# Patient Record
Sex: Female | Born: 1960 | Race: White | Hispanic: No | Marital: Married | Smoking: Never smoker
Health system: Southern US, Community
[De-identification: ages and names within clinical notes are randomized; demographics above are authoritative.]

## PROBLEM LIST (undated history)

## (undated) DIAGNOSIS — R87619 Unspecified abnormal cytological findings in specimens from cervix uteri: Secondary | ICD-10-CM

## (undated) DIAGNOSIS — B977 Papillomavirus as the cause of diseases classified elsewhere: Secondary | ICD-10-CM

## (undated) HISTORY — PX: LASER ABLATION CONDYLOMA CERVICAL / VULVAR: SUR819

## (undated) HISTORY — DX: Unspecified abnormal cytological findings in specimens from cervix uteri: R87.619

## (undated) HISTORY — DX: Papillomavirus as the cause of diseases classified elsewhere: B97.7

---
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
## 2015-04-28 DIAGNOSIS — M9906 Segmental and somatic dysfunction of lower extremity: Secondary | ICD-10-CM | POA: Diagnosis not present

## 2015-04-28 DIAGNOSIS — M25512 Pain in left shoulder: Secondary | ICD-10-CM | POA: Diagnosis not present

## 2015-04-28 DIAGNOSIS — M9903 Segmental and somatic dysfunction of lumbar region: Secondary | ICD-10-CM | POA: Diagnosis not present

## 2015-04-28 DIAGNOSIS — M9907 Segmental and somatic dysfunction of upper extremity: Secondary | ICD-10-CM | POA: Diagnosis not present

## 2016-04-01 DIAGNOSIS — J0140 Acute pansinusitis, unspecified: Secondary | ICD-10-CM | POA: Diagnosis not present

## 2016-04-13 ENCOUNTER — Encounter: Payer: Self-pay | Admitting: Obstetrics & Gynecology

## 2016-04-13 ENCOUNTER — Ambulatory Visit (INDEPENDENT_AMBULATORY_CARE_PROVIDER_SITE_OTHER): Payer: BLUE CROSS/BLUE SHIELD | Admitting: Obstetrics & Gynecology

## 2016-04-13 VITALS — BP 134/89 | HR 77 | Ht 67.0 in | Wt 161.0 lb

## 2016-04-13 DIAGNOSIS — Z01419 Encounter for gynecological examination (general) (routine) without abnormal findings: Secondary | ICD-10-CM | POA: Diagnosis not present

## 2016-04-13 NOTE — Progress Notes (Signed)
Subjective:    Connie Massey is a 56 y.o. MW P2 (61 and 20, 3 grands) female who presents for an annual exam. She moved her from [place]. She is not happy with increased hot flashes over the past several months. She has not tried OTC meds yet. The patient is sexually active. GYN screening history: last pap: was normal. The patient wears seatbelts: yes. The patient participates in regular exercise: yes. Has the patient ever been transfused or tattooed?: no. The patient reports that there is not domestic violence in her life.   Menstrual History: OB History    Gravida Para Term Preterm AB Living   2 2 2     2    SAB TAB Ectopic Multiple Live Births                  Menarche age: 24 No LMP recorded. Patient is postmenopausal.    The following portions of the patient's history were reviewed and updated as appropriate: allergies, current medications, past family history, past medical history, past social history, past surgical history and problem list.  Review of Systems Pertinent items are noted in HPI.   Married for 36 years Denies dyspareunia Menopause was at 56 yo Negligible stress incontinence FH- + breast cancer in mGM and Maunt in later years, +lymphoma in her mom, no gyn or colon cancer No colonscopy ever No FP mammo due Had laser surgery on her cervix in the 1980s for viral infection  Objective:    BP 134/89   Pulse 77   Ht 5\' 7"  (1.702 m)   Wt 161 lb (73 kg)   BMI 25.22 kg/m   General Appearance:    Alert, cooperative, no distress, appears stated age  Head:    Normocephalic, without obvious abnormality, atraumatic  Eyes:    PERRL, conjunctiva/corneas clear, EOM's intact, fundi    benign, both eyes  Ears:    Normal TM's and external ear canals, both ears  Nose:   Nares normal, septum midline, mucosa normal, no drainage    or sinus tenderness  Throat:   Lips, mucosa, and tongue normal; teeth and gums normal  Neck:   Supple, symmetrical, trachea midline, no adenopathy;   thyroid:  no enlargement/tenderness/nodules; no carotid   bruit or JVD  Back:     Symmetric, no curvature, ROM normal, no CVA tenderness  Lungs:     Clear to auscultation bilaterally, respirations unlabored  Chest Wall:    No tenderness or deformity   Heart:    Regular rate and rhythm, S1 and S2 normal, no murmur, rub   or gallop  Breast Exam:    No tenderness, masses, or nipple abnormality  Abdomen:     Soft, non-tender, bowel sounds active all four quadrants,    no masses, no organomegaly  Genitalia:    Normal female without lesion, discharge or tenderness, Vulvovaginal atrophy, NSSA, NT, mobile, NT, no adnexal masses     Extremities:   Extremities normal, atraumatic, no cyanosis or edema  Pulses:   2+ and symmetric all extremities  Skin:   Skin color, texture, turgor normal, no rashes or lesions  Lymph nodes:   Cervical, supraclavicular, and axillary nodes normal  Neurologic:   CNII-XII intact, normal strength, sensation and reflexes    throughout  .    Assessment:    Healthy female exam.    Plan:     Thin prep Pap smear. with cotesting Mammogram ordered Discussed treatments for hot flashes/v v a.

## 2016-04-17 LAB — CYTOLOGY - PAP
DIAGNOSIS: NEGATIVE
HPV: NOT DETECTED

## 2016-04-19 ENCOUNTER — Ambulatory Visit (INDEPENDENT_AMBULATORY_CARE_PROVIDER_SITE_OTHER): Payer: BLUE CROSS/BLUE SHIELD

## 2016-04-19 DIAGNOSIS — Z1231 Encounter for screening mammogram for malignant neoplasm of breast: Secondary | ICD-10-CM

## 2016-04-19 DIAGNOSIS — Z01419 Encounter for gynecological examination (general) (routine) without abnormal findings: Secondary | ICD-10-CM

## 2017-04-24 ENCOUNTER — Encounter: Payer: Self-pay | Admitting: Obstetrics & Gynecology

## 2017-04-24 ENCOUNTER — Ambulatory Visit (INDEPENDENT_AMBULATORY_CARE_PROVIDER_SITE_OTHER): Payer: BLUE CROSS/BLUE SHIELD | Admitting: Obstetrics & Gynecology

## 2017-04-24 VITALS — BP 141/80 | HR 76 | Ht 67.0 in | Wt 162.0 lb

## 2017-04-24 DIAGNOSIS — Z1151 Encounter for screening for human papillomavirus (HPV): Secondary | ICD-10-CM

## 2017-04-24 DIAGNOSIS — Z01419 Encounter for gynecological examination (general) (routine) without abnormal findings: Secondary | ICD-10-CM | POA: Diagnosis not present

## 2017-04-24 DIAGNOSIS — N852 Hypertrophy of uterus: Secondary | ICD-10-CM | POA: Diagnosis not present

## 2017-04-24 DIAGNOSIS — Z124 Encounter for screening for malignant neoplasm of cervix: Secondary | ICD-10-CM | POA: Diagnosis not present

## 2017-04-24 DIAGNOSIS — N898 Other specified noninflammatory disorders of vagina: Secondary | ICD-10-CM

## 2017-04-24 DIAGNOSIS — Z23 Encounter for immunization: Secondary | ICD-10-CM

## 2017-04-24 DIAGNOSIS — Z113 Encounter for screening for infections with a predominantly sexual mode of transmission: Secondary | ICD-10-CM

## 2017-04-24 NOTE — Progress Notes (Signed)
Pt c/o hot flashes.

## 2017-04-24 NOTE — Progress Notes (Signed)
Subjective:    Connie Massey is a 57 y.o. married P58female who presents for an annual exam. The patient has no complaints today. Hot flashes have returned but she wants to "live with them".  The patient is sexually active. GYN screening history: last pap: was normal. The patient wears seatbelts: yes. The patient participates in regular exercise: yes. Has the patient ever been transfused or tattooed?: no. The patient reports that there is not domestic violence in her life.   Menstrual History: OB History    Gravida  2   Para  2   Term  2   Preterm      AB      Living  2     SAB      TAB      Ectopic      Multiple      Live Births              Menarche age: 24 No LMP recorded. Patient is postmenopausal.    The following portions of the patient's history were reviewed and updated as appropriate: allergies, current medications, past family history, past medical history, past social history, past surgical history and problem list.  Review of Systems Pertinent items are noted in HPI.   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)   Objective:    BP (!) 141/80   Pulse 76   Ht 5\' 7"  (1.702 m)   Wt 162 lb (73.5 kg)   BMI 25.37 kg/m   General Appearance:    Alert, cooperative, no distress, appears stated age  Head:    Normocephalic, without obvious abnormality, atraumatic  Eyes:    PERRL, conjunctiva/corneas clear, EOM's intact, fundi    benign, both eyes  Ears:    Normal TM's and external ear canals, both ears  Nose:   Nares normal, septum midline, mucosa normal, no drainage    or sinus tenderness  Throat:   Lips, mucosa, and tongue normal; teeth and gums normal  Neck:   Supple, symmetrical, trachea midline, no adenopathy;    thyroid:  no enlargement/tenderness/nodules; no carotid   bruit or JVD  Back:     Symmetric, no curvature, ROM normal, no CVA tenderness  Lungs:     Clear to auscultation bilaterally, respirations unlabored  Chest Wall:     No tenderness or deformity   Heart:    Regular rate and rhythm, S1 and S2 normal, no murmur, rub   or gallop  Breast Exam:    No tenderness, masses, or nipple abnormality  Abdomen:     Soft, non-tender, bowel sounds active all four quadrants,    no masses, no organomegaly  Genitalia:    Normal female without lesion, discharge or tenderness, 6 week size, mobile, non tender, non palpable adnexa     Extremities:   Extremities normal, atraumatic, no cyanosis or edema  Pulses:   2+ and symmetric all extremities  Skin:   Skin color, texture, turgor normal, no rashes or lesions  Lymph nodes:   Cervical, supraclavicular, and axillary nodes normal  Neurologic:   CNII-XII intact, normal strength, sensation and reflexes    throughout  .    Assessment:    Healthy female exam.   change in uterine size Plan:     Thin prep Pap smear. with cotesting Fasting labs today (she did have a little yogourt about 4 hour ago) HIV, Hep C Discussed Colon screening TDAP Mammogram Gyn u/s

## 2017-04-25 LAB — LIPID PANEL
CHOL/HDL RATIO: 2.3 (calc) (ref <5.0)
CHOLESTEROL: 172 mg/dL (ref <200)
HDL: 74 mg/dL (ref >50)
LDL CHOLESTEROL (CALC): 84 mg/dL
Non-HDL Cholesterol (Calc): 98 mg/dL (calc) (ref <130)
TRIGLYCERIDES: 65 mg/dL (ref <150)

## 2017-04-25 LAB — HIV ANTIBODY (ROUTINE TESTING W REFLEX): HIV 1&2 Ab, 4th Generation: NONREACTIVE

## 2017-04-25 LAB — COMPREHENSIVE METABOLIC PANEL
AG Ratio: 1.3 (calc) (ref 1.0–2.5)
ALKALINE PHOSPHATASE (APISO): 62 U/L (ref 33–130)
ALT: 17 U/L (ref 6–29)
AST: 24 U/L (ref 10–35)
Albumin: 4.1 g/dL (ref 3.6–5.1)
BILIRUBIN TOTAL: 0.5 mg/dL (ref 0.2–1.2)
BUN: 14 mg/dL (ref 7–25)
CO2: 26 mmol/L (ref 20–32)
Calcium: 9.3 mg/dL (ref 8.6–10.4)
Chloride: 103 mmol/L (ref 98–110)
Creat: 0.73 mg/dL (ref 0.50–1.05)
Globulin: 3.1 g/dL (calc) (ref 1.9–3.7)
Glucose, Bld: 90 mg/dL (ref 65–99)
Potassium: 4.4 mmol/L (ref 3.5–5.3)
Sodium: 141 mmol/L (ref 135–146)
Total Protein: 7.2 g/dL (ref 6.1–8.1)

## 2017-04-25 LAB — CBC
HEMATOCRIT: 42.2 % (ref 35.0–45.0)
Hemoglobin: 15 g/dL (ref 11.7–15.5)
MCH: 31.6 pg (ref 27.0–33.0)
MCHC: 35.5 g/dL (ref 32.0–36.0)
MCV: 89 fL (ref 80.0–100.0)
MPV: 10.1 fL (ref 7.5–12.5)
Platelets: 208 10*3/uL (ref 140–400)
RBC: 4.74 10*6/uL (ref 3.80–5.10)
RDW: 11.6 % (ref 11.0–15.0)
WBC: 7.6 10*3/uL (ref 3.8–10.8)

## 2017-04-25 LAB — HEPATITIS C ANTIBODY
Hepatitis C Ab: NONREACTIVE
SIGNAL TO CUT-OFF: 0.03 (ref <1.00)

## 2017-04-25 LAB — VITAMIN D 25 HYDROXY (VIT D DEFICIENCY, FRACTURES): Vit D, 25-Hydroxy: 38 ng/mL (ref 30–100)

## 2017-04-25 LAB — TSH: TSH: 0.44 m[IU]/L (ref 0.40–4.50)

## 2017-04-26 LAB — CERVICOVAGINAL ANCILLARY ONLY
Bacterial vaginitis: POSITIVE — AB
Candida vaginitis: NEGATIVE

## 2017-04-27 LAB — CYTOLOGY - PAP
DIAGNOSIS: NEGATIVE
HPV: NOT DETECTED

## 2017-04-30 ENCOUNTER — Telehealth: Payer: Self-pay

## 2017-04-30 DIAGNOSIS — B9689 Other specified bacterial agents as the cause of diseases classified elsewhere: Secondary | ICD-10-CM

## 2017-04-30 DIAGNOSIS — N760 Acute vaginitis: Principal | ICD-10-CM

## 2017-04-30 MED ORDER — METRONIDAZOLE 500 MG PO TABS: 500.0000 mg | ORAL_TABLET | Freq: Two times a day (BID) | ORAL | 0 refills | Status: DC

## 2017-04-30 NOTE — Telephone Encounter (Signed)
PT returning my call. PT is aware of positive BV and Flagyl being sent to her pharmacy per Dr. Hulan Fray. Pt expressed understanding.

## 2017-04-30 NOTE — Telephone Encounter (Signed)
Attempted to call pt with positive BV results and to verify pharmacy for Flagyl. Pt did not answer. Left message on voicemail to call office for results and provided phone number.

## 2017-05-04 ENCOUNTER — Ambulatory Visit (INDEPENDENT_AMBULATORY_CARE_PROVIDER_SITE_OTHER): Payer: BLUE CROSS/BLUE SHIELD

## 2017-05-04 ENCOUNTER — Other Ambulatory Visit: Payer: Self-pay | Admitting: Obstetrics & Gynecology

## 2017-05-04 DIAGNOSIS — D251 Intramural leiomyoma of uterus: Secondary | ICD-10-CM

## 2017-05-04 DIAGNOSIS — Z1231 Encounter for screening mammogram for malignant neoplasm of breast: Secondary | ICD-10-CM

## 2017-05-04 DIAGNOSIS — Z01419 Encounter for gynecological examination (general) (routine) without abnormal findings: Secondary | ICD-10-CM

## 2017-05-04 DIAGNOSIS — N852 Hypertrophy of uterus: Secondary | ICD-10-CM

## 2017-11-08 ENCOUNTER — Ambulatory Visit (INDEPENDENT_AMBULATORY_CARE_PROVIDER_SITE_OTHER): Payer: 59 | Admitting: Family Medicine

## 2017-11-08 ENCOUNTER — Encounter: Payer: Self-pay | Admitting: Family Medicine

## 2017-11-08 VITALS — BP 108/67 | HR 79 | Resp 16 | Ht 67.0 in | Wt 166.0 lb

## 2017-11-08 DIAGNOSIS — R232 Flushing: Secondary | ICD-10-CM

## 2017-11-08 DIAGNOSIS — Z780 Asymptomatic menopausal state: Secondary | ICD-10-CM

## 2017-11-08 HISTORY — DX: Asymptomatic menopausal state: Z78.0

## 2017-11-08 HISTORY — DX: Flushing: R23.2

## 2017-11-08 MED ORDER — PROGESTERONE MICRONIZED 100 MG PO CAPS
100.0000 mg | ORAL_CAPSULE | Freq: Every day | ORAL | 3 refills | Status: DC
Start: 2017-11-08 — End: 2018-11-07

## 2017-11-08 MED ORDER — ESTRADIOL 0.05 MG/24HR TD PTTW: 1.0000 | MEDICATED_PATCH | TRANSDERMAL | 3 refills | Status: DC

## 2017-11-08 NOTE — Progress Notes (Signed)
   Subjective:    Patient ID: Connie Massey is a 57 y.o. female presenting with menopausal issues  on 11/08/2017  HPI: For past 2 years has c/o hot flashes with her annual. Was trying to just deal with them, as she does not like meds, but has noticed worsening. Having hot flashes and getting dizzy while having them. Some days there are 10 and sometimes only 1. Having night sweats as well and soaks the bed. Unsure what is triggering. Has tried [place] and diet. Nothing seems to be working. Notes increasing vaginal dryness. No cycle x 2 years. Reports problems with intimacy due to painful intercourse and then she is avoiding this, despite good water based lubricants.  Review of Systems  Constitutional: Negative for chills and fever.  Respiratory: Negative for shortness of breath.   Cardiovascular: Negative for chest pain.  Gastrointestinal: Negative for abdominal pain, nausea and vomiting.  Genitourinary: Negative for dysuria.  Skin: Negative for rash.      Objective:    BP 108/67   Pulse 79   Resp 16   Ht 5\' 7"  (1.702 m)   Wt 166 lb (75.3 kg)   BMI 26.00 kg/m  Physical Exam  Constitutional: She is oriented to person, place, and time. She appears well-developed and well-nourished. No distress.  HENT:  Head: Normocephalic and atraumatic.  Eyes: No scleral icterus.  Neck: Neck supple.  Cardiovascular: Normal rate.  Pulmonary/Chest: Effort normal.  Abdominal: Soft.  Neurological: She is alert and oriented to person, place, and time.  Skin: Skin is warm and dry.  Psychiatric: She has a normal mood and affect.      Assessment & Plan:   Problem List Items Addressed This Visit      Unprioritized   Hot flashes - Primary   Relevant Medications   estradiol (VIVELLE-DOT) 0.05 MG/24HR patch   progesterone (PROMETRIUM) 100 MG capsule   Menopause    Advised of HRT risks and benefits.  Discussed bone loss, supplemental Ca and Vitamin D, heart health, breast cancer.  Also discussed  changes related to menopause, normal progression and resolution of symptoms.  Advised to not stay on longer than 5 years and to be on lowest dose possible. Will proceed with transdermal estrogen and daily prometrium (consider levonorgestrel containing IUD for endometrial protection).           Total face-to-face time with patient: 25 minutes. Over 50% of encounter was spent on counseling and coordination of care. Return in about 4 weeks (around 12/06/2017) for a follow-up.  Donnamae Jude 11/08/2017 1:41 PM

## 2017-11-08 NOTE — Assessment & Plan Note (Signed)
Advised of HRT risks and benefits.  Discussed bone loss, supplemental Ca and Vitamin D, heart health, breast cancer.  Also discussed changes related to menopause, normal progression and resolution of symptoms.  Advised to not stay on longer than 5 years and to be on lowest dose possible. Will proceed with transdermal estrogen and daily prometrium (consider levonorgestrel containing IUD for endometrial protection).

## 2017-12-06 ENCOUNTER — Ambulatory Visit (INDEPENDENT_AMBULATORY_CARE_PROVIDER_SITE_OTHER): Payer: 59 | Admitting: Obstetrics & Gynecology

## 2017-12-06 ENCOUNTER — Encounter: Payer: Self-pay | Admitting: Obstetrics & Gynecology

## 2017-12-06 VITALS — BP 124/80 | HR 73 | Resp 16 | Ht 67.0 in | Wt 165.0 lb

## 2017-12-06 DIAGNOSIS — R232 Flushing: Secondary | ICD-10-CM | POA: Diagnosis not present

## 2017-12-06 DIAGNOSIS — Z780 Asymptomatic menopausal state: Secondary | ICD-10-CM

## 2017-12-06 NOTE — Progress Notes (Signed)
   Subjective:    Patient ID: Connie Massey, female    DOB: 03-18-1960, 57 y.o.   MRN: [id number]  HPI 57 yo married P2 here for follow up after starting vivielle dot (.05mg /24 hour) and prometrium 100mg  qhs. She is very happy that the hot flashes are markedly improved. 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 She reports less vaginal dryness. These things all started to improve within a few weeks of starting the meds. She has had some breast tenderness.   Review of Systems Pap normal 4/19 Mammogram 5/19    Objective:   Physical Exam Breathing, conversing, and ambulating normally Well nourished, well hydrated White female, no apparent distress Vulva with only mild atrophy Abd- benign     Assessment & Plan:  Menopausal symptoms much improved with meds With regard to the breast tenderness, I offered watchful waiting versus changing to an oral pill. At this time she would like to continue with the current plan Come back 1 year/prn sooner

## 2018-08-07 ENCOUNTER — Other Ambulatory Visit: Payer: Self-pay | Admitting: Obstetrics & Gynecology

## 2018-08-07 DIAGNOSIS — Z1239 Encounter for other screening for malignant neoplasm of breast: Secondary | ICD-10-CM

## 2018-09-12 ENCOUNTER — Other Ambulatory Visit: Payer: Self-pay

## 2018-09-12 ENCOUNTER — Ambulatory Visit (INDEPENDENT_AMBULATORY_CARE_PROVIDER_SITE_OTHER): Payer: 59

## 2018-09-12 DIAGNOSIS — Z1239 Encounter for other screening for malignant neoplasm of breast: Secondary | ICD-10-CM

## 2018-11-07 ENCOUNTER — Encounter: Payer: Self-pay | Admitting: Obstetrics & Gynecology

## 2018-11-07 ENCOUNTER — Ambulatory Visit (INDEPENDENT_AMBULATORY_CARE_PROVIDER_SITE_OTHER): Payer: 59 | Admitting: Obstetrics & Gynecology

## 2018-11-07 ENCOUNTER — Other Ambulatory Visit: Payer: Self-pay

## 2018-11-07 VITALS — BP 117/75 | HR 72 | Resp 16 | Ht 67.0 in | Wt 169.0 lb

## 2018-11-07 DIAGNOSIS — Z1151 Encounter for screening for human papillomavirus (HPV): Secondary | ICD-10-CM

## 2018-11-07 DIAGNOSIS — Z124 Encounter for screening for malignant neoplasm of cervix: Secondary | ICD-10-CM

## 2018-11-07 DIAGNOSIS — R232 Flushing: Secondary | ICD-10-CM

## 2018-11-07 DIAGNOSIS — Z01419 Encounter for gynecological examination (general) (routine) without abnormal findings: Secondary | ICD-10-CM | POA: Diagnosis not present

## 2018-11-07 MED ORDER — PROGESTERONE MICRONIZED 100 MG PO CAPS: 100.0000 mg | ORAL_CAPSULE | Freq: Every day | ORAL | 5 refills | Status: DC

## 2018-11-07 MED ORDER — ESTRADIOL 0.05 MG/24HR TD PTTW: 1.0000 | MEDICATED_PATCH | TRANSDERMAL | 6 refills | Status: DC

## 2018-11-07 NOTE — Progress Notes (Signed)
Subjective:    Connie Massey is a 58 y.o. married P2 (38 and 40 yo kids, 3 1/2 grands) who presents for an annual exam. The patient has no complaints today. The patient is sexually active. GYN screening history: last pap: was normal. The patient wears seatbelts: yes. The patient participates in regular exercise: yes. Has the patient ever been transfused or tattooed?: no. The patient reports that there is not domestic violence in her life.   Menstrual History: OB History    Gravida  2   Para  2   Term  2   Preterm      AB      Living  2     SAB      TAB      Ectopic      Multiple      Live Births              Menarche age: 76 No LMP recorded. Patient is postmenopausal.    The following portions of the patient's history were reviewed and updated as appropriate: allergies, current medications, past family history, past medical history, past social history, past surgical history and problem list.  Review of Systems Pertinent items are noted in HPI.   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)   Objective:    BP 117/75   Pulse 72   Resp 16   Ht 5\' 7"  (1.702 m)   Wt 169 lb (76.7 kg)   BMI 26.47 kg/m   General Appearance:    Alert, cooperative, no distress, appears stated age  Head:    Normocephalic, without obvious abnormality, atraumatic  Eyes:    PERRL, conjunctiva/corneas clear, EOM's intact, fundi    benign, both eyes  Ears:    Normal TM's and external ear canals, both ears  Nose:   Nares normal, septum midline, mucosa normal, no drainage    or sinus tenderness  Throat:   Lips, mucosa, and tongue normal; teeth and gums normal  Neck:   Supple, symmetrical, trachea midline, no adenopathy;    thyroid:  no enlargement/tenderness/nodules; no carotid   bruit or JVD  Back:     Symmetric, no curvature, ROM normal, no  CVA tenderness  Lungs:     Clear to auscultation bilaterally, respirations unlabored  Chest Wall:    No tenderness or deformity   Heart:    Regular rate and rhythm, S1 and S2 normal, no murmur, rub   or gallop  Breast Exam:    No tenderness, masses, or nipple abnormality  Abdomen:     Soft, non-tender, bowel sounds active all four quadrants,    no masses, no organomegaly  Genitalia:    Normal female without lesion, discharge or tenderness, mild vulvovaginal atrophy, normal size and shape, anteverted, mobile, non-tender, normal adnexal exam      Extremities:   Extremities normal, atraumatic, no cyanosis or edema  Pulses:   2+ and symmetric all extremities  Skin:   Skin color, texture, turgor normal, no rashes or lesions  Lymph nodes:   Cervical, supraclavicular, and axillary nodes normal  Neurologic:   CNII-XII intact, normal strength, sensation and reflexes    throughout  .    Assessment:    Healthy female exam.   stress   Plan:     Thin prep Pap smear. with cotesting She will let me  know if she wants to see Connie Massey Refill HRT

## 2018-11-11 LAB — CYTOLOGY - PAP
Adequacy: ABSENT
Comment: NEGATIVE
Diagnosis: NEGATIVE
High risk HPV: NEGATIVE

## 2019-03-06 ENCOUNTER — Other Ambulatory Visit: Payer: Self-pay

## 2019-03-06 ENCOUNTER — Encounter: Payer: Self-pay | Admitting: Medical-Surgical

## 2019-03-06 ENCOUNTER — Ambulatory Visit (INDEPENDENT_AMBULATORY_CARE_PROVIDER_SITE_OTHER): Payer: 59 | Admitting: Medical-Surgical

## 2019-03-06 VITALS — BP 126/84 | HR 71 | Temp 97.9°F | Ht 66.0 in | Wt 170.6 lb

## 2019-03-06 DIAGNOSIS — Z7689 Persons encountering health services in other specified circumstances: Secondary | ICD-10-CM | POA: Diagnosis not present

## 2019-03-06 DIAGNOSIS — R079 Chest pain, unspecified: Secondary | ICD-10-CM

## 2019-03-06 DIAGNOSIS — M79602 Pain in left arm: Secondary | ICD-10-CM

## 2019-03-06 DIAGNOSIS — Z0000 Encounter for general adult medical examination without abnormal findings: Secondary | ICD-10-CM

## 2019-03-06 DIAGNOSIS — G44219 Episodic tension-type headache, not intractable: Secondary | ICD-10-CM

## 2019-03-06 DIAGNOSIS — R232 Flushing: Secondary | ICD-10-CM | POA: Diagnosis not present

## 2019-03-06 DIAGNOSIS — I209 Angina pectoris, unspecified: Secondary | ICD-10-CM | POA: Insufficient documentation

## 2019-03-06 DIAGNOSIS — G4700 Insomnia, unspecified: Secondary | ICD-10-CM

## 2019-03-06 DIAGNOSIS — Z780 Asymptomatic menopausal state: Secondary | ICD-10-CM

## 2019-03-06 HISTORY — DX: Angina pectoris, unspecified: I20.9

## 2019-03-06 HISTORY — DX: Encounter for general adult medical examination without abnormal findings: Z00.00

## 2019-03-06 HISTORY — DX: Pain in left arm: M79.602

## 2019-03-06 HISTORY — DX: Chest pain, unspecified: R07.9

## 2019-03-06 LAB — COMPLETE METABOLIC PANEL WITH GFR
AG Ratio: 1.4 (calc) (ref 1.0–2.5)
ALT: 18 U/L (ref 6–29)
AST: 18 U/L (ref 10–35)
Albumin: 4.2 g/dL (ref 3.6–5.1)
Alkaline phosphatase (APISO): 52 U/L (ref 37–153)
BUN: 19 mg/dL (ref 7–25)
CO2: 27 mmol/L (ref 20–32)
Calcium: 9.5 mg/dL (ref 8.6–10.4)
Chloride: 105 mmol/L (ref 98–110)
Creat: 0.72 mg/dL (ref 0.50–1.05)
GFR, Est African American: 106 mL/min/{1.73_m2} (ref >=60)
GFR, Est Non African American: 92 mL/min/{1.73_m2} (ref >=60)
Globulin: 2.9 g/dL (calc) (ref 1.9–3.7)
Glucose, Bld: 89 mg/dL (ref 65–139)
Potassium: 4.1 mmol/L (ref 3.5–5.3)
Sodium: 140 mmol/L (ref 135–146)
Total Bilirubin: 0.5 mg/dL (ref 0.2–1.2)
Total Protein: 7.1 g/dL (ref 6.1–8.1)

## 2019-03-06 LAB — CBC
HCT: 43.8 % (ref 35.0–45.0)
Hemoglobin: 14.8 g/dL (ref 11.7–15.5)
MCH: 31.7 pg (ref 27.0–33.0)
MCHC: 33.8 g/dL (ref 32.0–36.0)
MCV: 93.8 fL (ref 80.0–100.0)
MPV: 9.4 fL (ref 7.5–12.5)
Platelets: 248 10*3/uL (ref 140–400)
RBC: 4.67 10*6/uL (ref 3.80–5.10)
RDW: 11.3 % (ref 11.0–15.0)
WBC: 6 10*3/uL (ref 3.8–10.8)

## 2019-03-06 LAB — LIPID PANEL
Cholesterol: 197 mg/dL (ref <200)
HDL: 79 mg/dL (ref >=50)
LDL Cholesterol (Calc): 101 mg/dL (calc) — ABNORMAL HIGH
Non-HDL Cholesterol (Calc): 118 mg/dL (calc) (ref <130)
Total CHOL/HDL Ratio: 2.5 (calc) (ref <5.0)
Triglycerides: 76 mg/dL (ref <150)

## 2019-03-06 LAB — TSH: TSH: 0.5 mIU/L (ref 0.40–4.50)

## 2019-03-06 MED ORDER — TRAZODONE HCL 50 MG PO TABS: 25.0000 mg | ORAL_TABLET | Freq: Every evening | ORAL | 3 refills | Status: DC | PRN

## 2019-03-06 NOTE — Progress Notes (Addendum)
New Patient Office Visit  Subjective:  Patient ID: Connie Massey, female    DOB: 1960/06/12  Age: 59 y.o. MRN: [id number]  CC:  Chief Complaint  Patient presents with  . Establish Care  . Arm Pain  . Insomnia  . Headache    HPI Connie Massey presents to establish care.  Was referred by her OB/GYN, Dr. Hulan Fray.  Last annual physical-11/07/2018 Last Pap smear completed 04/24/2017, normal Last mammogram- 2019 Colonoscopy- due, has a kit en route Last Tdap- unknown  HA- intermittent, ~2x week for the last month, frontal behind eyes like sinus, not responding to Ibuprofen, has not tried tylenol. Increased life stress with many hours spent staring at a computer screen.   Chest pain/left upper arm pain- intermittent, mostly when exercising. Walk 15 min daily and exercises 3 times weekly. Started 5-6 months ago. SOB with exertion. Resolves with rest. No nausea, no diaphoresis.   Insomnia- sleeping on average <6 hours per night, nonrestorative. Tried Melatonin, OTC sleep aids. Falls asleep fine but wake early and cannot fall back to sleep due to racing thoughts. Snores occasionally.  Menopause/HRT- on estrogen patches and oral progesterone for approximately 2 years. Tolerating well. Reports having an unexpected menstrual cycle with 5 days of regular flow and increased cramping 1 month ago after no cycle for 2 years. Past Medical History:  Diagnosis Date  . Abnormal Pap smear of cervix   . HPV in female     Past Surgical History:  Procedure Laterality Date  . [place]  . LASER ABLATION CONDYLOMA CERVICAL / VULVAR      Family History  Problem Relation Age of Onset  . Breast cancer Maternal Grandmother   . Lymphoma Mother   . Kidney cancer Brother   . Breast cancer Maternal Aunt   . Lymphoma Maternal Aunt     Social History   Socioeconomic History  . Marital status: Unknown    Spouse name: Not on file  . Number of children: Not on file  . Years of education: Not on file   . Highest education level: Not on file  Occupational History  . Occupation: Radiation protection practitioner  Tobacco Use  . Smoking status: Never Smoker  . Smokeless tobacco: Never Used  Substance and Sexual Activity  . Alcohol use: Yes    Comment: Rarely, drinks wine  . Drug use: Never  . Sexual activity: Yes    Birth control/protection: Post-menopausal, Surgical  Other Topics Concern  . Not on file  Social History Narrative  . Not on file   Social Determinants of Health   Financial Resource Strain:   . Difficulty of Paying Living Expenses: Not on file  Food Insecurity:   . Worried About Charity fundraiser in the Last Year: Not on file  . Ran Out of Food in the Last Year: Not on file  Transportation Needs:   . Lack of Transportation (Medical): Not on file  . Lack of Transportation (Non-Medical): Not on file  Physical Activity:   . Days of Exercise per Week: Not on file  . Minutes of Exercise per Session: Not on file  Stress:   . Feeling of Stress : Not on file  Social Connections:   . Frequency of Communication with Friends and Family: Not on file  . Frequency of Social Gatherings with Friends and Family: Not on file  . Attends Religious Services: Not on file  . Active Member of Clubs or Organizations: Not on file  . Attends  Club or Organization Meetings: Not on file  . Marital Status: Not on file  Intimate Partner Violence:   . Fear of Current or Ex-Partner: Not on file  . Emotionally Abused: Not on file  . Physically Abused: Not on file  . Sexually Abused: Not on file    ROS Review of Systems  Constitutional: Negative for chills, fever and unexpected weight change.  HENT: Negative for sinus pressure and sinus pain.   Eyes: Negative for photophobia and visual disturbance.  Respiratory: Positive for shortness of breath (with exertion). Negative for cough and chest tightness.   Cardiovascular: Positive for chest pain (left upper chest with exercise) and palpitations (occasional).  Negative for leg swelling.  Gastrointestinal: Negative for nausea.  Endocrine: Negative for cold intolerance and heat intolerance.  Musculoskeletal: Positive for myalgias (left anterior upper arm pain).  Neurological: Positive for headaches (intermittent frontal ). Negative for dizziness, light-headedness and numbness.  Psychiatric/Behavioral: Positive for sleep disturbance. Negative for self-injury and suicidal ideas.    Objective:   Today's Vitals: BP 126/84   Pulse 71   Temp 97.9 F (36.6 C) (Oral)   Ht _0  (1.676 m)   Wt 170 lb 9.6 oz (77.4 kg)   SpO2 100%   BMI 27.54 kg/m   Physical Exam Vitals reviewed.  Constitutional:      General: She is not in acute distress.    Appearance: She is well-developed.  HENT:     Head: Normocephalic and atraumatic.  Cardiovascular:     Rate and Rhythm: Normal rate and regular rhythm.     Heart sounds: Normal heart sounds. No murmur. No friction rub. No gallop.   Pulmonary:     Effort: Pulmonary effort is normal. No respiratory distress.     Breath sounds: Normal breath sounds. No wheezing.  Musculoskeletal:        General: Tenderness (left upper chest pain not reproducible in office; left anterior arm pain but no tenderness) present.  Skin:    General: Skin is warm and dry.  Neurological:     Mental Status: She is alert and oriented to person, place, and time.     GCS: GCS eye subscore is 4. GCS verbal subscore is 5. GCS motor subscore is 6.  Psychiatric:        Mood and Affect: Mood normal. Mood is not anxious or depressed.        Speech: Speech normal.        Behavior: Behavior normal.     Assessment & Plan:   1. Encounter to establish care Getting labs today. Up to date on all preventative care except due for Tdap. Declined Tdap at this time as she wants to get the COVID vaccine first. Agreed to revisit this later. - CBC - COMPLETE METABOLIC PANEL WITH GFR - Lipid panel - TSH  2. Menopause Continue HRT as  prescribed.  3. Hot flashes Continue HRT as prescribed.  4. Chest pain, unspecified type In office EKG- normal axis, rate, and rhythm but possible septal infarct, 2nd was run to recheck- sinus bradycardia. Presence of symptoms mainly during exercise suspicious for angina. Would benefit from exercise stress test. Checking labs and referring to Cardiology for further workup. - CBC - COMPLETE METABOLIC PANEL WITH GFR - Lipid panel - TSH - EKG 12-Lead  5. Preventative health care Will complete colon cancer screening. Checking labs. - CBC - COMPLETE METABOLIC PANEL WITH GFR - Lipid panel - TSH  6. Pain in anterior left upper extremity  No MSK abnormality identified. Possible cardiac etiology. Referring to Cardiology. - Ambulatory referral to Cardiology  7. Insomnia Trazadone 25-16m nightly as needed for sleep. Reviewed sleep hygiene practices and sleep expectations.  8. Headaches Presentation most common with tension-type headaches. Will try Trazadone to improve sleep. Reduce life stress and avoid digital screens as much as possible. May use OTC Excedrine Migraine or other headache medications as needed.  Outpatient Encounter Medications as of 03/06/2019  Medication Sig  . estradiol (VIVELLE-DOT) 0.05 MG/24HR patch Place 1 patch (0.05 mg total) onto the skin 2 (two) times a week.  . progesterone (PROMETRIUM) 100 MG capsule Take 1 capsule (100 mg total) by mouth daily.  . traZODone (DESYREL) 50 MG tablet Take 0.5-1 tablets (25-50 mg total) by mouth at bedtime as needed for sleep.   No facility-administered encounter medications on file as of 03/06/2019.    Follow-up: Return in about 1 year (around 03/05/2020) for annual physical exam and labs.   JClearnce Sorrel DNP, APRN, FNP-BC C[facility]Primary [facility]

## 2019-03-07 DIAGNOSIS — G44219 Episodic tension-type headache, not intractable: Secondary | ICD-10-CM

## 2019-03-07 DIAGNOSIS — G4700 Insomnia, unspecified: Secondary | ICD-10-CM | POA: Insufficient documentation

## 2019-03-07 HISTORY — DX: Insomnia, unspecified: G47.00

## 2019-03-07 HISTORY — DX: Episodic tension-type headache, not intractable: G44.219

## 2019-03-24 ENCOUNTER — Encounter: Payer: Self-pay | Admitting: Cardiology

## 2019-03-24 ENCOUNTER — Ambulatory Visit (INDEPENDENT_AMBULATORY_CARE_PROVIDER_SITE_OTHER): Payer: 59 | Admitting: Cardiology

## 2019-03-24 ENCOUNTER — Other Ambulatory Visit: Payer: Self-pay

## 2019-03-24 VITALS — BP 110/78 | HR 63 | Ht 66.0 in | Wt 173.1 lb

## 2019-03-24 DIAGNOSIS — R0600 Dyspnea, unspecified: Secondary | ICD-10-CM | POA: Diagnosis not present

## 2019-03-24 DIAGNOSIS — R0609 Other forms of dyspnea: Secondary | ICD-10-CM | POA: Insufficient documentation

## 2019-03-24 DIAGNOSIS — E785 Hyperlipidemia, unspecified: Secondary | ICD-10-CM

## 2019-03-24 DIAGNOSIS — R072 Precordial pain: Secondary | ICD-10-CM

## 2019-03-24 HISTORY — DX: Other forms of dyspnea: R06.09

## 2019-03-24 HISTORY — DX: Hyperlipidemia, unspecified: E78.5

## 2019-03-24 HISTORY — DX: Dyspnea, unspecified: R06.00

## 2019-03-24 MED ORDER — ASPIRIN EC 81 MG PO TBEC: 81.0000 mg | DELAYED_RELEASE_TABLET | Freq: Every day | ORAL | 3 refills | Status: AC

## 2019-03-24 MED ORDER — NITROGLYCERIN 0.4 MG SL SUBL: 0.4000 mg | SUBLINGUAL_TABLET | SUBLINGUAL | 11 refills | Status: DC | PRN

## 2019-03-24 NOTE — Patient Instructions (Signed)
Medication Instructions:  Your physician has recommended you make the following change in your medication:   START: Aspirin 81 mg daily   TAKE AS NEEDED FOR CHEST PAIN: Nitroglycerin 0.4 mg sublingual (under your tongue) as needed for chest pain. If experiencing chest pain, stop what you are doing and sit down. Take 1 nitroglycerin and wait 5 minutes. If chest pain continues, take another nitroglycerin and wait 5 minutes. If chest pain does not subside, take 1 more nitroglycerin and dial 911. You make take a total of 3 nitroglycerin in a 15 minute time frame.  *If you need a refill on your cardiac medications before your next appointment, please call your pharmacy*   Lab Work: None.  If you have labs (blood work) drawn today and your tests are completely normal, you will receive your results only by: [name] MyChart Message (if you have MyChart) OR . A paper copy in the mail If you have any lab test that is abnormal or we need to change your treatment, we will call you to review the results.   Testing/Procedures: Your physician has requested that you have an exercise tolerance test. For further information please visit [email or web address]. Please also follow instruction sheet, as given.     Follow-Up: At [facility], you and your health needs are our priority.  As part of our continuing mission to provide you with exceptional heart care, we have created designated Provider Care Teams.  These Care Teams include your primary Cardiologist (physician) and Advanced Practice Providers (APPs -  Physician Assistants and Nurse Practitioners) who all work together to provide you with the care you need, when you need it.  We recommend signing up for the patient portal called "MyChart".  Sign up information is provided on this After Visit Summary.  MyChart is used to connect with patients for Virtual Visits (Telemedicine).  Patients are able to view lab/test results, encounter notes, upcoming  appointments, etc.  Non-urgent messages can be sent to your provider as well.   To learn more about what you can do with MyChart, go to [email or web address].    Your next appointment:   1 month(s)  The format for your next appointment:   In Person  Provider:   Jenne Campus, MD   Other Instructions   Aspirin and Your Heart  Aspirin is a medicine that prevents the cells in the blood that are used for clotting, called platelets, from sticking together. Aspirin can be used to help reduce the risk of blood clots, heart attacks, and other heart-related problems. Can I take aspirin? Your health care provider will help you determine whether it is safe and beneficial for you to take aspirin daily. Taking aspirin daily may be helpful if you:  Have had a heart attack or chest pain.  Are at risk for a heart attack.  Have undergone open-heart surgery, such as coronary artery bypass surgery (CABG).  Have had coronary angioplasty or a stent.  Have had certain types of stroke or transient ischemic attack (TIA).  Have peripheral artery disease (PAD).  Have chronic heart rhythm problems such as atrial fibrillation and cannot take an anticoagulant.  Have valve disease or have had surgery on a valve. What are the risks? Daily use of aspirin can cause side effects. Some of these include:  Bleeding. Bleeding problems can be minor or serious. An example of a minor problem is a cut that does not stop bleeding. An example of a more serious problem is stomach bleeding or, rarely,  bleeding into the brain. Your risk of bleeding is increased if you are also taking non-steroidal anti-inflammatory drugs (NSAIDs).  Increased bruising.  Upset stomach.  An allergic reaction. People who have nasal polyps have an increased risk of developing an aspirin allergy. General guidelines  Take aspirin only as told by your health care provider. Make sure that you understand how much you should take and  what form you should take. The two forms of aspirin are: ? Non-enteric-coated.This type of aspirin does not have a coating and is absorbed quickly. This type of aspirin also comes in a chewable form. ? Enteric-coated. This type of aspirin has a coating that releases the medicine very slowly. Enteric-coated aspirin might cause less stomach upset than non-enteric-coated aspirin. This type of aspirin should not be chewed or crushed.  Limit alcohol intake to no more than 1 drink a day for nonpregnant women and 2 drinks a day for men. Drinking alcohol increases your risk of bleeding. One drink equals 12 oz of beer, 5 oz of wine, or 1 oz of hard liquor. Contact a health care provider if you:  Have unusual bleeding or bruising.  Have stomach pain or nausea.  Have ringing in your ears.  Have an allergic reaction that causes: ? Hives. ? Itchy skin. ? Swelling of the lips, tongue, or face. Get help right away if you:  Notice that your bowel movements are bloody, dark red, or black in color.  Vomit or cough up blood.  Have blood in your urine.  Cough, have noisy breathing (wheeze), or feel short of breath.  Have chest pain, especially if the pain spreads to the arms, back, neck, or jaw.  Have a severe headache, or a headache with confusion, or dizziness. These symptoms may represent a serious problem that is an emergency. Do not wait to see if the symptoms will go away. Get medical help right away. Call your local emergency services (911 in the [place] Do not drive yourself to the hospital. Summary  Aspirin can be used to help reduce the risk of blood clots, heart attacks, and other heart-related problems.  Daily use of aspirin can increase your risk of side effects. Your health care provider will help you determine whether it is safe and beneficial for you to take aspirin daily.  Take aspirin only as told by your health care provider. Make sure that you understand how much you can take and  what form you can take. This information is not intended to replace advice given to you by your health care provider. Make sure you discuss any questions you have with your health care provider. Document Revised: 10/19/2016 Document Reviewed: 10/19/2016 Elsevier Patient Education  [employer].  Nitroglycerin sublingual tablets What is this medicine? NITROGLYCERIN (nye troe GLI ser in) is a type of vasodilator. It relaxes blood vessels, increasing the blood and oxygen supply to your heart. This medicine is used to relieve chest pain caused by angina. It is also used to prevent chest pain before activities like climbing stairs, going outdoors in cold weather, or sexual activity. This medicine may be used for other purposes; ask your health care provider or pharmacist if you have questions. COMMON BRAND NAME(S): Nitroquick, Nitrostat, Nitrotab What should I tell my health care provider before I take this medicine? They need to know if you have any of these conditions:  anemia  head injury, recent stroke, or bleeding in the brain  liver disease  previous heart attack  an unusual  or allergic reaction to nitroglycerin, other medicines, foods, dyes, or preservatives  pregnant or trying to get pregnant  breast-feeding How should I use this medicine? Take this medicine by mouth as needed. At the first sign of an angina attack (chest pain or tightness) place one tablet under your tongue. You can also take this medicine 5 to 10 minutes before an event likely to produce chest pain. Follow the directions on the prescription label. Let the tablet dissolve under the tongue. Do not swallow whole. Replace the dose if you accidentally swallow it. It will help if your mouth is not dry. Saliva around the tablet will help it to dissolve more quickly. Do not eat or drink, smoke or chew tobacco while a tablet is dissolving. If you are not better within 5 minutes after taking ONE dose of nitroglycerin,  call 9-1-1 immediately to seek emergency medical care. Do not take more than 3 nitroglycerin tablets over 15 minutes. If you take this medicine often to relieve symptoms of angina, your doctor or health care professional may provide you with different instructions to manage your symptoms. If symptoms do not go away after following these instructions, it is important to call 9-1-1 immediately. Do not take more than 3 nitroglycerin tablets over 15 minutes. Talk to your pediatrician regarding the use of this medicine in children. Special care may be needed. Overdosage: If you think you have taken too much of this medicine contact a poison control center or emergency room at once. NOTE: This medicine is only for you. Do not share this medicine with others. What if I miss a dose? This does not apply. This medicine is only used as needed. What may interact with this medicine? Do not take this medicine with any of the following medications:  certain migraine medicines like ergotamine and dihydroergotamine (DHE)  medicines used to treat erectile dysfunction like sildenafil, tadalafil, and vardenafil  riociguat This medicine may also interact with the following medications:  alteplase  aspirin  heparin  medicines for high blood pressure  medicines for mental depression  other medicines used to treat angina  phenothiazines like chlorpromazine, mesoridazine, prochlorperazine, thioridazine This list may not describe all possible interactions. Give your health care provider a list of all the medicines, herbs, non-prescription drugs, or dietary supplements you use. Also tell them if you smoke, drink alcohol, or use illegal drugs. Some items may interact with your medicine. What should I watch for while using this medicine? Tell your doctor or health care professional if you feel your medicine is no longer working. Keep this medicine with you at all times. Sit or lie down when you take your  medicine to prevent falling if you feel dizzy or faint after using it. Try to remain calm. This will help you to feel better faster. If you feel dizzy, take several deep breaths and lie down with your feet propped up, or bend forward with your head resting between your knees. You may get drowsy or dizzy. Do not drive, use machinery, or do anything that needs mental alertness until you know how this drug affects you. Do not stand or sit up quickly, especially if you are an older patient. This reduces the risk of dizzy or fainting spells. Alcohol can make you more drowsy and dizzy. Avoid alcoholic drinks. Do not treat yourself for coughs, colds, or pain while you are taking this medicine without asking your doctor or health care professional for advice. Some ingredients may increase your blood pressure. What  side effects may I notice from receiving this medicine? Side effects that you should report to your doctor or health care professional as soon as possible:  blurred vision  dry mouth  skin rash  sweating  the feeling of extreme pressure in the head  unusually weak or tired Side effects that usually do not require medical attention (report to your doctor or health care professional if they continue or are bothersome):  flushing of the face or neck  headache  irregular heartbeat, palpitations  nausea, vomiting This list may not describe all possible side effects. Call your doctor for medical advice about side effects. You may report side effects to FDA at 1-800-FDA-1088. Where should I keep my medicine? Keep out of the reach of children. Store at room temperature between 20 and 25 degrees C (68 and 77 degrees F). Store in Chief of Staff. Protect from light and moisture. Keep tightly closed. Throw away any unused medicine after the expiration date. NOTE: This sheet is a summary. It may not cover all possible information. If you have questions about this medicine, talk to your doctor,  pharmacist, or health care provider.  2020 Elsevier/Gold Standard (2012-10-17 17:57:36)

## 2019-03-24 NOTE — Progress Notes (Signed)
Cardiology Consultation:    Date:  03/24/2019   ID:  Connie Massey, DOB 03-Jun-1960, MRN [id number]  PCP:  Samuel Bouche, NP  Cardiologist:  Jenne Campus, MD   Referring MD: Samuel Bouche, NP   No chief complaint on file. I have left arm and chest pain  History of Present Illness:    Connie Massey is a 59 y.o. female who is being seen today for the evaluation of chest pain at the request of Samuel Bouche, NP.  She does not have risk factors for coronary artery disease.  She does not have hypertension she does not have diabetes, she never smoked does not have family history of premature coronary artery disease, her LDL is 101 however her HDL is 79.  She was referred to me because of left arm pain as well as chest pain.  She walks on the regular basis with her dog, within last few months she noted left arm pain as well as some chest sensation when she walks uphill.  It also gave her some shortness of breath.  When she gone downhill on flat ground there is no problem only going uphill.  She thinks within the last few months this sensation became a little more frequent and easier to get.  Stopping make this sensation goes away. Denies having any dizziness or passing out She still very active. She is on hormone replacement therapy  Past Medical History:  Diagnosis Date  . Abnormal Pap smear of cervix   . Chest pain 03/06/2019  . Episodic tension-type headache, not intractable 03/07/2019  . Hot flashes 11/08/2017  . HPV in female   . Insomnia 03/07/2019  . Menopause 11/08/2017  . Pain in anterior left upper extremity 03/06/2019  . Preventative health care 03/06/2019    Past Surgical History:  Procedure Laterality Date  . [place]  . LASER ABLATION CONDYLOMA CERVICAL / VULVAR      Current Medications: Current Meds  Medication Sig  . estradiol (VIVELLE-DOT) 0.05 MG/24HR patch Place 1 patch (0.05 mg total) onto the skin 2 (two) times a week.  . progesterone (PROMETRIUM) 100 MG capsule Take  1 capsule (100 mg total) by mouth daily.  . traZODone (DESYREL) 50 MG tablet Take 0.5-1 tablets (25-50 mg total) by mouth at bedtime as needed for sleep.     Allergies:   Sulfa antibiotics   Social History   Socioeconomic History  . Marital status: Unknown    Spouse name: Not on file  . Number of children: Not on file  . Years of education: Not on file  . Highest education level: Not on file  Occupational History  . Occupation: Radiation protection practitioner  Tobacco Use  . Smoking status: Never Smoker  . Smokeless tobacco: Never Used  Substance and Sexual Activity  . Alcohol use: Yes    Comment: Rarely, drinks wine  . Drug use: Never  . Sexual activity: Yes    Birth control/protection: Post-menopausal, Surgical  Other Topics Concern  . Not on file  Social History Narrative  . Not on file   Social Determinants of Health   Financial Resource Strain:   . Difficulty of Paying Living Expenses:   Food Insecurity:   . Worried About Charity fundraiser in the Last Year:   . Arboriculturist in the Last Year:   Transportation Needs:   . Film/video editor (Medical):   [name] Lack of Transportation (Non-Medical):   Physical Activity:   . Days of  Exercise per Week:   . Minutes of Exercise per Session:   Stress:   . Feeling of Stress :   Social Connections:   . Frequency of Communication with Friends and Family:   . Frequency of Social Gatherings with Friends and Family:   . Attends Religious Services:   . Active Member of Clubs or Organizations:   . Attends Archivist Meetings:   [name] Marital Status:      Family History: The patient's family history includes Breast cancer in her maternal aunt and maternal grandmother; Kidney cancer in her brother; Lymphoma in her maternal aunt and mother. ROS:   Please see the history of present illness.    All 14 point review of systems negative except as described per history of present illness.  EKGs/Labs/Other Studies Reviewed:    The  following studies were reviewed today:   EKG:  EKG is  ordered today.  The ekg ordered today demonstrates normal sinus rhythm, normal P interval, normal QS complex duration morphology  Recent Labs: 03/06/2019: ALT 18; BUN 19; Creat 0.72; Hemoglobin 14.8; Platelets 248; Potassium 4.1; Sodium 140; TSH 0.50  Recent Lipid Panel    Component Value Date/Time   CHOL 197 03/06/2019 1102   TRIG 76 03/06/2019 1102   HDL 79 03/06/2019 1102   CHOLHDL 2.5 03/06/2019 1102   LDLCALC 101 (H) 03/06/2019 1102    Physical Exam:    VS:  BP 110/78 (BP Location: Right Arm, Patient Position: Sitting, Cuff Size: Normal)   Pulse 63   Ht 5\' 6"  (1.676 m)   Wt 173 lb 1.9 oz (78.5 kg)   BMI 27.94 kg/m     Wt Readings from Last 3 Encounters:  03/24/19 173 lb 1.9 oz (78.5 kg)  03/06/19 170 lb 9.6 oz (77.4 kg)  11/07/18 169 lb (76.7 kg)     GEN:  Well nourished, well developed in no acute distress HEENT: Normal NECK: No JVD; No carotid bruits LYMPHATICS: No lymphadenopathy CARDIAC: RRR, no murmurs, no rubs, no gallops RESPIRATORY:  Clear to auscultation without rales, wheezing or rhonchi  ABDOMEN: Soft, non-tender, non-distended MUSCULOSKELETAL:  No edema; No deformity  SKIN: Warm and dry NEUROLOGIC:  Alert and oriented x 3 PSYCHIATRIC:  Normal affect   ASSESSMENT:    1. Precordial pain   2. Dyspnea on exertion   3. Dyslipidemia    PLAN:    In order of problems listed above:  1. Precordial chest pain: Quite interesting scenario.  She presented with fairly suspicious chest pain.  It is induced by exercise, relieved by rest, associated with shortness of breath.  But she does not have any risk factors for coronary artery disease.  We debated about what to do with the situation we debated between medical therapy versus stress testing versus CT angio versus cardiac catheterization.  She preferred to have her plan EKG treadmill stress test.  Therefore, I will schedule her for this test.  In the  meantime ask her to start taking 1 baby aspirin every single day, I will give her nitroglycerin on as-needed basis.  There is some notation in the chart that she was bradycardic before, therefore, I will not give her beta-blocker.  I asked her not to exercise too much until we do stress testing.  And also asked her to go to the emergency room if nitroglycerin does not relieve the pain. 2. Dyspnea on exertion.  Again stress test will be done to assess her exercise capacity. 3. Dyslipidemia no need  to treat right now.  However decision will be made based on results of her stress testing.   Medication Adjustments/Labs and Tests Ordered: Current medicines are reviewed at length with the patient today.  Concerns regarding medicines are outlined above.  No orders of the defined types were placed in this encounter.  No orders of the defined types were placed in this encounter.   Signed, Park Liter, MD, [facility]. 03/24/2019 9:34 AM    [place]

## 2019-03-29 ENCOUNTER — Other Ambulatory Visit (HOSPITAL_COMMUNITY): Payer: 59

## 2019-04-02 ENCOUNTER — Encounter (HOSPITAL_COMMUNITY): Payer: 59

## 2019-04-10 ENCOUNTER — Telehealth (HOSPITAL_COMMUNITY): Payer: Self-pay | Admitting: *Deleted

## 2019-04-10 NOTE — Telephone Encounter (Signed)
Close encounter 

## 2019-04-12 ENCOUNTER — Other Ambulatory Visit (HOSPITAL_COMMUNITY)
Admission: RE | Admit: 2019-04-12 | Discharge: 2019-04-12 | Disposition: A | Discharge status: Home / Self Care | Payer: 59 | Source: Ambulatory Visit | Attending: Cardiology | Admitting: Cardiology

## 2019-04-12 DIAGNOSIS — Z20822 Contact with and (suspected) exposure to covid-19: Secondary | ICD-10-CM | POA: Diagnosis not present

## 2019-04-12 DIAGNOSIS — Z01812 Encounter for preprocedural laboratory examination: Secondary | ICD-10-CM | POA: Insufficient documentation

## 2019-04-12 LAB — SARS CORONAVIRUS 2 (TAT 6-24 HRS): SARS Coronavirus 2: NEGATIVE

## 2019-04-16 ENCOUNTER — Ambulatory Visit (HOSPITAL_COMMUNITY)
Admission: RE | Admit: 2019-04-16 | Discharge: 2019-04-16 | Disposition: A | Discharge status: Home / Self Care | Payer: 59 | Source: Ambulatory Visit | Attending: Cardiovascular Disease | Admitting: Cardiovascular Disease

## 2019-04-16 ENCOUNTER — Other Ambulatory Visit: Payer: Self-pay

## 2019-04-16 DIAGNOSIS — R0600 Dyspnea, unspecified: Secondary | ICD-10-CM

## 2019-04-16 DIAGNOSIS — R072 Precordial pain: Secondary | ICD-10-CM | POA: Insufficient documentation

## 2019-04-17 LAB — EXERCISE TOLERANCE TEST
Estimated workload: 12.2 METS
Exercise duration (min): 10 min
Exercise duration (sec): 17 s
MPHR: 160 {beats}/min
Peak HR: 166 {beats}/min
Percent HR: 103 %
RPE: 17
Rest HR: 60 {beats}/min

## 2019-04-23 ENCOUNTER — Telehealth: Payer: Self-pay | Admitting: Cardiology

## 2019-04-23 ENCOUNTER — Encounter: Payer: Self-pay | Admitting: Cardiology

## 2019-04-23 ENCOUNTER — Other Ambulatory Visit: Payer: Self-pay

## 2019-04-23 ENCOUNTER — Ambulatory Visit (INDEPENDENT_AMBULATORY_CARE_PROVIDER_SITE_OTHER): Payer: 59 | Admitting: Cardiology

## 2019-04-23 VITALS — BP 108/70 | HR 70 | Ht 66.0 in | Wt 175.0 lb

## 2019-04-23 DIAGNOSIS — R0600 Dyspnea, unspecified: Secondary | ICD-10-CM

## 2019-04-23 DIAGNOSIS — R0609 Other forms of dyspnea: Secondary | ICD-10-CM

## 2019-04-23 DIAGNOSIS — E785 Hyperlipidemia, unspecified: Secondary | ICD-10-CM

## 2019-04-23 DIAGNOSIS — R072 Precordial pain: Secondary | ICD-10-CM

## 2019-04-23 MED ORDER — ATORVASTATIN CALCIUM 10 MG PO TABS: 10.0000 mg | ORAL_TABLET | Freq: Every day | ORAL | 3 refills | Status: DC

## 2019-04-23 MED ORDER — METOPROLOL TARTRATE 25 MG PO TABS: 25.0000 mg | ORAL_TABLET | Freq: Every day | ORAL | 3 refills | Status: DC

## 2019-04-23 MED ORDER — METOPROLOL SUCCINATE ER 25 MG PO TB24: 25.0000 mg | ORAL_TABLET | Freq: Every day | ORAL | 3 refills | Status: DC

## 2019-04-23 MED ORDER — METOPROLOL TARTRATE 25 MG PO TABS: 25.0000 mg | ORAL_TABLET | Freq: Two times a day (BID) | ORAL | 3 refills | Status: DC

## 2019-04-23 NOTE — Patient Instructions (Addendum)
Medication Instructions:  1) Start Metoprolol Succinate (Toprol XL) 25 mg daily   2) Start Atorvastatin (Lipitor) 10 mg daily   *If you need a refill on your cardiac medications before your next appointment, please call your pharmacy*   Lab Work: None ordered   If you have labs (blood work) drawn today and your tests are completely normal, you will receive your results only by: [name] MyChart Message (if you have MyChart) OR . A paper copy in the mail If you have any lab test that is abnormal or we need to change your treatment, we will call you to review the results.   Testing/Procedures: None ordered    Follow-Up: At [facility], you and your health needs are our priority.  As part of our continuing mission to provide you with exceptional heart care, we have created designated Provider Care Teams.  These Care Teams include your primary Cardiologist (physician) and Advanced Practice Providers (APPs -  Physician Assistants and Nurse Practitioners) who all work together to provide you with the care you need, when you need it.  We recommend signing up for the patient portal called "MyChart".  Sign up information is provided on this After Visit Summary.  MyChart is used to connect with patients for Virtual Visits (Telemedicine).  Patients are able to view lab/test results, encounter notes, upcoming appointments, etc.  Non-urgent messages can be sent to your provider as well.   To learn more about what you can do with MyChart, go to [email or web address].    Your next appointment:   1 month(s)  The format for your next appointment:   In Person  Provider:   Jenne Campus, MD   Other Instructions None

## 2019-04-23 NOTE — Telephone Encounter (Signed)
Pt c/o medication issue:  1. Name of Medication:   metoprolol tartrate (LOPRESSOR) 25 MG tablet    2. How are you currently taking this medication (dosage and times per day)? As directed  3. Are you having a reaction (difficulty breathing--STAT)? No  4. What is your medication issue? [facility] called because two prescriptions were sent in for the medication. Connie Massey states that one prescription was for 90 tablets to take 1x daily and one was for 180 tablets to take 2x daily. She would like to know which prescription to fill.

## 2019-04-23 NOTE — Progress Notes (Signed)
Cardiology Office Note:    Date:  04/23/2019   ID:  Connie Massey, DOB February 26, 1960, MRN [id number]  PCP:  Samuel Bouche, NP  Cardiologist:  Jenne Campus, MD    Referring MD: Samuel Bouche, NP   Chief Complaint  Patient presents with  . Follow-up  To discuss results of the test  History of Present Illness:    Connie Massey is a 59 y.o. female she came to me with a chief complaint of exertional chest pain.  Also she gets pain with motions.  Pain is promptly relieved by rest.  Interestingly she does not have risk factors for coronary artery disease.  She does not smoke her cholesterol is acceptable with LDL of 101 and HDL very high.  She does not have family history of premature coronary artery disease.  She did have a plain EKG treadmill stress that she walked 10 minutes and developed ST segment depression diagnostic for ischemia.  I brought her today to the office to talk about it.  The reason why we decided to plan EKG treadmill stress test was her financial concerns.  She did not want to do an expensive test.  She tells me that she still gets some pain when she try to walk sometimes when she gets upset.  Interestingly when she walk on the treadmill she did not feel it but she said she was so excited about walking she may have some and simply did not feel.  Past Medical History:  Diagnosis Date  . Abnormal Pap smear of cervix   . Chest pain 03/06/2019  . Episodic tension-type headache, not intractable 03/07/2019  . Hot flashes 11/08/2017  . HPV in female   . Insomnia 03/07/2019  . Menopause 11/08/2017  . Pain in anterior left upper extremity 03/06/2019  . Preventative health care 03/06/2019    Past Surgical History:  Procedure Laterality Date  . [place]  . LASER ABLATION CONDYLOMA CERVICAL / VULVAR      Current Medications: Current Meds  Medication Sig  . aspirin EC 81 MG tablet Take 1 tablet (81 mg total) by mouth daily.  [name] estradiol (VIVELLE-DOT) 0.05 MG/24HR patch Place 1 patch  (0.05 mg total) onto the skin 2 (two) times a week.  . nitroGLYCERIN (NITROSTAT) 0.4 MG SL tablet Place 1 tablet (0.4 mg total) under the tongue every 5 (five) minutes as needed.  . progesterone (PROMETRIUM) 100 MG capsule Take 1 capsule (100 mg total) by mouth daily.     Allergies:   Sulfa antibiotics   Social History   Socioeconomic History  . Marital status: Unknown    Spouse name: Not on file  . Number of children: Not on file  . Years of education: Not on file  . Highest education level: Not on file  Occupational History  . Occupation: Radiation protection practitioner  Tobacco Use  . Smoking status: Never Smoker  . Smokeless tobacco: Never Used  Substance and Sexual Activity  . Alcohol use: Yes    Comment: Rarely, drinks wine  . Drug use: Never  . Sexual activity: Yes    Birth control/protection: Post-menopausal, Surgical  Other Topics Concern  . Not on file  Social History Narrative  . Not on file   Social Determinants of Health   Financial Resource Strain:   . Difficulty of Paying Living Expenses:   Food Insecurity:   . Worried About Charity fundraiser in the Last Year:   . [place] in the Last Year:  Transportation Needs:   . Film/video editor (Medical):   [name] Lack of Transportation (Non-Medical):   Physical Activity:   . Days of Exercise per Week:   . Minutes of Exercise per Session:   Stress:   . Feeling of Stress :   Social Connections:   . Frequency of Communication with Friends and Family:   . Frequency of Social Gatherings with Friends and Family:   . Attends Religious Services:   . Active Member of Clubs or Organizations:   . Attends Archivist Meetings:   [name] Marital Status:      Family History: The patient's family history includes Breast cancer in her maternal aunt and maternal grandmother; Kidney cancer in her brother; Lymphoma in her maternal aunt and mother. ROS:   Please see the history of present illness.    All 14 point review of  systems negative except as described per history of present illness  EKGs/Labs/Other Studies Reviewed:      Recent Labs: 03/06/2019: ALT 18; BUN 19; Creat 0.72; Hemoglobin 14.8; Platelets 248; Potassium 4.1; Sodium 140; TSH 0.50  Recent Lipid Panel    Component Value Date/Time   CHOL 197 03/06/2019 1102   TRIG 76 03/06/2019 1102   HDL 79 03/06/2019 1102   CHOLHDL 2.5 03/06/2019 1102   LDLCALC 101 (H) 03/06/2019 1102    Physical Exam:    VS:  BP 108/70   Pulse 70   Ht 5\' 6"  (1.676 m)   Wt 175 lb (79.4 kg)   SpO2 97%   BMI 28.25 kg/m     Wt Readings from Last 3 Encounters:  04/23/19 175 lb (79.4 kg)  03/24/19 173 lb 1.9 oz (78.5 kg)  03/06/19 170 lb 9.6 oz (77.4 kg)     GEN:  Well nourished, well developed in no acute distress HEENT: Normal NECK: No JVD; No carotid bruits LYMPHATICS: No lymphadenopathy CARDIAC: RRR, no murmurs, no rubs, no gallops RESPIRATORY:  Clear to auscultation without rales, wheezing or rhonchi  ABDOMEN: Soft, non-tender, non-distended MUSCULOSKELETAL:  No edema; No deformity  SKIN: Warm and dry LOWER EXTREMITIES: no swelling NEUROLOGIC:  Alert and oriented x 3 PSYCHIATRIC:  Normal affect   ASSESSMENT:    1. Precordial pain   2. Dyslipidemia   3. Dyspnea on exertion    PLAN:    In order of problems listed above:  1. Precordial pain.  Abnormal stress test showing evidence of ischemia but good exercise tolerance therefore overall risk is slightly low.  We had a long discussion about what to do with the situation and I presented her with 3 options first option was simply medical therapy option #2 was to do coronary CT angio, option #3 cardiac catheterization we discussed all options all pro and cons.  Basically we end up with medications for now and then she will investigate about prices of those are additional test and based on that we will decide which way she wished to go.  In the meantime I put her metoprolol succinate 25 mg daily only  small dose because of low blood pressure.  I will also put her on statin Lipitor 10 she is already taking aspirin which I will continue. 2. Dyslipidemia Lipitor will be initiated. 3. Dyspnea on exertion abnormal stress test discussion about future investigation of this problem.  I will see her back in my office in about 1 month or sooner if she got a problem.   Medication Adjustments/Labs and Tests Ordered: Current medicines are reviewed  at length with the patient today.  Concerns regarding medicines are outlined above.  No orders of the defined types were placed in this encounter.  Medication changes:  Meds ordered this encounter  Medications  . DISCONTD: metoprolol tartrate (LOPRESSOR) 25 MG tablet    Sig: Take 1 tablet (25 mg total) by mouth 2 (two) times daily.    Dispense:  90 tablet    Refill:  3  . atorvastatin (LIPITOR) 10 MG tablet    Sig: Take 1 tablet (10 mg total) by mouth daily.    Dispense:  90 tablet    Refill:  3  . metoprolol tartrate (LOPRESSOR) 25 MG tablet    Sig: Take 1 tablet (25 mg total) by mouth daily.    Dispense:  90 tablet    Refill:  3    Signed, Park Liter, MD, [facility] 04/23/2019 9:12 AM    [place]

## 2019-04-24 NOTE — Telephone Encounter (Signed)
Returned call to [facility], left detailed message. Disregard prescription for Metoprolol Tartrate, complete the order for the Metoprolol Succinate after reviewing the notes from her OV yesterday with Dr. Agustin Cree. Deleted the erroneous prescription for the Metoprolol Tartrate.

## 2019-04-24 NOTE — Telephone Encounter (Signed)
Connie Massey is calling back due to still not receiving a call in regards to this. Please advise.

## 2019-04-25 ENCOUNTER — Telehealth: Payer: Self-pay | Admitting: Cardiology

## 2019-04-25 NOTE — Telephone Encounter (Signed)
° °  Pt said when she saw Dr. Agustin Cree last 04/21, she was advise she needs cardiac cath, she is checking with her insurance if they will pay for it but she needs the procedure code to give to her insurance   Please advise

## 2019-04-29 NOTE — Telephone Encounter (Signed)
Called patient gave her the procedure code for left heart cath and left and right heart cath because she asked for both. She will check with her insurance and let us know how she wants to proceed.

## 2019-06-06 ENCOUNTER — Ambulatory Visit: Payer: 59 | Admitting: Cardiology

## 2019-06-16 ENCOUNTER — Encounter: Payer: Self-pay | Admitting: Cardiology

## 2019-06-16 ENCOUNTER — Other Ambulatory Visit: Payer: Self-pay

## 2019-06-16 ENCOUNTER — Ambulatory Visit (INDEPENDENT_AMBULATORY_CARE_PROVIDER_SITE_OTHER): Payer: 59 | Admitting: Cardiology

## 2019-06-16 VITALS — BP 94/70 | HR 72 | Ht 66.0 in | Wt 172.0 lb

## 2019-06-16 DIAGNOSIS — I209 Angina pectoris, unspecified: Secondary | ICD-10-CM | POA: Diagnosis not present

## 2019-06-16 DIAGNOSIS — R0600 Dyspnea, unspecified: Secondary | ICD-10-CM

## 2019-06-16 DIAGNOSIS — R0609 Other forms of dyspnea: Secondary | ICD-10-CM

## 2019-06-16 DIAGNOSIS — E785 Hyperlipidemia, unspecified: Secondary | ICD-10-CM

## 2019-06-16 NOTE — Patient Instructions (Signed)
Medication Instructions:  Your physician recommends that you continue on your current medications as directed. Please refer to the Current Medication list given to you today.  *If you need a refill on your cardiac medications before your next appointment, please call your pharmacy*   Lab Work: Your physician recommends that you return for lab work today: lipid  If you have labs (blood work) drawn today and your tests are completely normal, you will receive your results only by: . MyChart Message (if you have MyChart) OR . A paper copy in the mail If you have any lab test that is abnormal or we need to change your treatment, we will call you to review the results.   Testing/Procedures: None   Follow-Up: At [facility], you and your health needs are our priority.  As part of our continuing mission to provide you with exceptional heart care, we have created designated Provider Care Teams.  These Care Teams include your primary Cardiologist (physician) and Advanced Practice Providers (APPs -  Physician Assistants and Nurse Practitioners) who all work together to provide you with the care you need, when you need it.  We recommend signing up for the patient portal called "MyChart".  Sign up information is provided on this After Visit Summary.  MyChart is used to connect with patients for Virtual Visits (Telemedicine).  Patients are able to view lab/test results, encounter notes, upcoming appointments, etc.  Non-urgent messages can be sent to your provider as well.   To learn more about what you can do with MyChart, go to [email or web address].    Your next appointment:   3 month(s)  The format for your next appointment:   In Person  Provider:   Robert Krasowski, MD   Other Instructions   

## 2019-06-16 NOTE — Progress Notes (Signed)
Cardiology Office Note:    Date:  06/16/2019   ID:  Kenyon Eshleman, DOB 12-04-1960, MRN [id number]  PCP:  Samuel Bouche, NP  Cardiologist:  Jenne Campus, MD    Referring MD: Samuel Bouche, NP   Chief Complaint  Patient presents with  . Follow-up  I am doing well  History of Present Illness:    Connie Massey is a 59 y.o. female with past medical history significant for exertional chest tightness. She does not have much risk factors except for mildly elevated cholesterol treated with statin now. She presented to [place] initially with chest pain that happened with emotion on exertion. Fairly suspicious for coronary artery disease. At that time we debated which way to evaluate her coronary arteries. Because of financial restraints she decided to go for a plain EKG treadmill stress test which was done she would did walk very well 10 minutes on the treadmill did not have any chest pain but did have changes on the EKG suggesting ischemia. After that were talk about options including CT coronary angio as well as cardiac catheterization. However, again because of financial issues she prefers medical therapy. Last time I put her on very small dose of metoprolol as she seems to be doing well. Still very active still walks every single day. Does not have exertional chest pain now. And she is happy the way she is. Still prefers medical therapy.  Past Medical History:  Diagnosis Date  . Abnormal Pap smear of cervix   . Chest pain 03/06/2019  . Episodic tension-type headache, not intractable 03/07/2019  . Hot flashes 11/08/2017  . HPV in female   . Insomnia 03/07/2019  . Menopause 11/08/2017  . Pain in anterior left upper extremity 03/06/2019  . Preventative health care 03/06/2019    Past Surgical History:  Procedure Laterality Date  . [place]  . LASER ABLATION CONDYLOMA CERVICAL / VULVAR      Current Medications: Current Meds  Medication Sig  . aspirin EC 81 MG tablet Take 1 tablet (81 mg total) by  mouth daily.  [name] atorvastatin (LIPITOR) 10 MG tablet Take 1 tablet (10 mg total) by mouth daily.  [name] estradiol (VIVELLE-DOT) 0.05 MG/24HR patch Place 1 patch (0.05 mg total) onto the skin 2 (two) times a week.  . metoprolol succinate (TOPROL XL) 25 MG 24 hr tablet Take 1 tablet (25 mg total) by mouth daily.  . nitroGLYCERIN (NITROSTAT) 0.4 MG SL tablet Place 1 tablet (0.4 mg total) under the tongue every 5 (five) minutes as needed.  . progesterone (PROMETRIUM) 100 MG capsule Take 1 capsule (100 mg total) by mouth daily.     Allergies:   Sulfa antibiotics   Social History   Socioeconomic History  . Marital status: Unknown    Spouse name: Not on file  . Number of children: Not on file  . Years of education: Not on file  . Highest education level: Not on file  Occupational History  . Occupation: Radiation protection practitioner  Tobacco Use  . Smoking status: Never Smoker  . Smokeless tobacco: Never Used  Vaping Use  . Vaping Use: Never used  Substance and Sexual Activity  . Alcohol use: Yes    Comment: Rarely, drinks wine  . Drug use: Never  . Sexual activity: Yes    Birth control/protection: Post-menopausal, Surgical  Other Topics Concern  . Not on file  Social History Narrative  . Not on file   Social Determinants of Health   Financial Resource Strain:   .  Difficulty of Paying Living Expenses:   Food Insecurity:   . Worried About Charity fundraiser in the Last Year:   . Arboriculturist in the Last Year:   Transportation Needs:   . Film/video editor (Medical):   [name] Lack of Transportation (Non-Medical):   Physical Activity:   . Days of Exercise per Week:   . Minutes of Exercise per Session:   Stress:   . Feeling of Stress :   Social Connections:   . Frequency of Communication with Friends and Family:   . Frequency of Social Gatherings with Friends and Family:   . Attends Religious Services:   . Active Member of Clubs or Organizations:   . Attends Archivist Meetings:   [name]  Marital Status:      Family History: The patient's family history includes Breast cancer in her maternal aunt and maternal grandmother; Kidney cancer in her brother; Lymphoma in her maternal aunt and mother. ROS:   Please see the history of present illness.    All 14 point review of systems negative except as described per history of present illness  EKGs/Labs/Other Studies Reviewed:      Recent Labs: 03/06/2019: ALT 18; BUN 19; Creat 0.72; Hemoglobin 14.8; Platelets 248; Potassium 4.1; Sodium 140; TSH 0.50  Recent Lipid Panel    Component Value Date/Time   CHOL 197 03/06/2019 1102   TRIG 76 03/06/2019 1102   HDL 79 03/06/2019 1102   CHOLHDL 2.5 03/06/2019 1102   LDLCALC 101 (H) 03/06/2019 1102    Physical Exam:    VS:  BP 94/70   Pulse 72   Ht 5\' 6"  (1.676 m)   Wt 172 lb (78 kg)   SpO2 99%   BMI 27.76 kg/m     Wt Readings from Last 3 Encounters:  06/16/19 172 lb (78 kg)  04/23/19 175 lb (79.4 kg)  03/24/19 173 lb 1.9 oz (78.5 kg)     GEN:  Well nourished, well developed in no acute distress HEENT: Normal NECK: No JVD; No carotid bruits LYMPHATICS: No lymphadenopathy CARDIAC: RRR, no murmurs, no rubs, no gallops RESPIRATORY:  Clear to auscultation without rales, wheezing or rhonchi  ABDOMEN: Soft, non-tender, non-distended MUSCULOSKELETAL:  No edema; No deformity  SKIN: Warm and dry LOWER EXTREMITIES: no swelling NEUROLOGIC:  Alert and oriented x 3 PSYCHIATRIC:  Normal affect   ASSESSMENT:    1. Dyslipidemia   2. Angina pectoris ([place])   3. Dyspnea on exertion    PLAN:    In order of problems listed above:  1. Dyslipidemia: I started her on Lipitor 10 mg daily. I will recheck her fasting lipid profile. 2. Angina pectoris with stress test showing ST segment changes however good exercise tolerance walked 10 minutes on the treadmill. Prefers medical therapy. Which I will continue. 3. Dyspnea on exertion doing fine from that point of view. Still active still  try to walk happy the way she is. 4. Coronary artery disease suspect. She does not want to pursue any aggressive work-up at this stage but overall seems to be doing quite well from a clinical point of view. We did talk about mechanism of coronary artery disease and what would be the situation but I would like to know immediately that will be if she start having chest pain at lower level of exercise or at rest. If pain became more severe or lasting longer. She understand that and she will let me know. Otherwise I will see  her back in about 3 months. We did talk also about healthy lifestyle.   Medication Adjustments/Labs and Tests Ordered: Current medicines are reviewed at length with the patient today.  Concerns regarding medicines are outlined above.  Orders Placed This Encounter  Procedures  . Lipid Profile   Medication changes: No orders of the defined types were placed in this encounter.   Signed, Park Liter, MD, [facility] 06/16/2019 8:35 AM    [place]

## 2019-08-21 ENCOUNTER — Other Ambulatory Visit: Payer: Self-pay | Admitting: Obstetrics & Gynecology

## 2019-08-21 ENCOUNTER — Encounter: Payer: Self-pay | Admitting: Sports Medicine

## 2019-08-21 ENCOUNTER — Other Ambulatory Visit: Payer: Self-pay

## 2019-08-21 ENCOUNTER — Ambulatory Visit (INDEPENDENT_AMBULATORY_CARE_PROVIDER_SITE_OTHER): Payer: 59 | Admitting: Sports Medicine

## 2019-08-21 ENCOUNTER — Ambulatory Visit (INDEPENDENT_AMBULATORY_CARE_PROVIDER_SITE_OTHER): Payer: 59

## 2019-08-21 DIAGNOSIS — Z1231 Encounter for screening mammogram for malignant neoplasm of breast: Secondary | ICD-10-CM

## 2019-08-21 DIAGNOSIS — M654 Radial styloid tenosynovitis [de Quervain]: Secondary | ICD-10-CM

## 2019-08-21 HISTORY — DX: Radial styloid tenosynovitis (de quervain): M65.4

## 2019-08-21 MED ORDER — MELOXICAM 15 MG PO TABS: ORAL_TABLET | ORAL | 3 refills | Status: DC

## 2019-08-21 NOTE — Assessment & Plan Note (Signed)
This is a pleasant 59 year old female, for 4 weeks now she said pain over the radial aspect of her right wrist. No trauma. On exam she has tenderness over the first extensor compartment, positive Finkelstein sign, no pain over the trapeziometacarpal joint. We are going to start conservatively, thumb spica brace, meloxicam, home rehab exercises, x-rays, return to see me in 1 month, first extensor compartment injection if no better.

## 2019-08-21 NOTE — Progress Notes (Signed)
    Procedures performed today:    None.  Independent interpretation of notes and tests performed by another provider:   None.  Brief History, Exam, Impression, and Recommendations:    De Quervain's tenosynovitis, right This is a pleasant 59 year old female, for 4 weeks now she said pain over the radial aspect of her right wrist. No trauma. On exam she has tenderness over the first extensor compartment, positive Finkelstein sign, no pain over the trapeziometacarpal joint. We are going to start conservatively, thumb spica brace, meloxicam, home rehab exercises, x-rays, return to see me in 1 month, first extensor compartment injection if no better.    ___________________________________________ Gwen Her. Dianah Field, M.D., ABFM., CAQSM. Primary Care and [place] Instructor of [employer] [facility] [employer]

## 2019-09-19 DIAGNOSIS — R87619 Unspecified abnormal cytological findings in specimens from cervix uteri: Secondary | ICD-10-CM | POA: Insufficient documentation

## 2019-09-19 DIAGNOSIS — B977 Papillomavirus as the cause of diseases classified elsewhere: Secondary | ICD-10-CM | POA: Insufficient documentation

## 2019-09-22 ENCOUNTER — Encounter: Payer: Self-pay | Admitting: Cardiology

## 2019-09-22 ENCOUNTER — Other Ambulatory Visit: Payer: Self-pay

## 2019-09-22 ENCOUNTER — Ambulatory Visit (INDEPENDENT_AMBULATORY_CARE_PROVIDER_SITE_OTHER): Payer: 59 | Admitting: Cardiology

## 2019-09-22 VITALS — BP 108/66 | HR 66 | Ht 67.0 in | Wt 174.1 lb

## 2019-09-22 DIAGNOSIS — R0609 Other forms of dyspnea: Secondary | ICD-10-CM

## 2019-09-22 DIAGNOSIS — I209 Angina pectoris, unspecified: Secondary | ICD-10-CM

## 2019-09-22 DIAGNOSIS — R0600 Dyspnea, unspecified: Secondary | ICD-10-CM | POA: Diagnosis not present

## 2019-09-22 DIAGNOSIS — E785 Hyperlipidemia, unspecified: Secondary | ICD-10-CM

## 2019-09-22 NOTE — Addendum Note (Signed)
Addended by: Senaida Ores on: 09/22/2019 08:34 AM   Modules accepted: Orders

## 2019-09-22 NOTE — Patient Instructions (Signed)

## 2019-09-22 NOTE — Progress Notes (Signed)
Cardiology Office Note:    Date:  09/22/2019   ID:  Bhakti Labella, DOB Aug 03, 1960, MRN [id number]  PCP:  Samuel Bouche, NP  Cardiologist:  Jenne Campus, MD    Referring MD: Samuel Bouche, NP   No chief complaint on file. I am doing fine  History of Present Illness:    Connie Massey is a 59 y.o. female with multiple risk factors for coronary artery disease that include mild dyslipidemia with negative LDL of 101, she presented to [place] with a chief complaint of exertional chest pain look fairly suspicious for angina pectoris, Canadian classification 1, she exercised on the regular basis without any difficulties. Only sometimes when she goes uphill she will develop some shortness of breath as well as chest tightness. We did plain EKG treadmill stress test. She lost 10 minutes on the treadmill developed some ST segment depression. After that we had discussion about potentially doing cardiac catheterization versus coronary CT angio. She does not want to do it. She is happy where she is. Since I seen her last time she is doing the same she still exercise on the regular basis at least 5 times a week walking and have no difficulty doing it. She did not notice any increase in frequency of shortness of breath and chest pain and overall very happy the way she is. She is treated with metoprolol which I will continue.  Past Medical History:  Diagnosis Date  . Abnormal Pap smear of cervix   . Angina pectoris ([place]) 03/06/2019  . Chest pain 03/06/2019  . De Quervain's tenosynovitis, right 08/21/2019  . Dyslipidemia 03/24/2019  . Dyspnea on exertion 03/24/2019  . Episodic tension-type headache, not intractable 03/07/2019  . Hot flashes 11/08/2017  . HPV in female   . Insomnia 03/07/2019  . Menopause 11/08/2017  . Pain in anterior left upper extremity 03/06/2019  . Preventative health care 03/06/2019    Past Surgical History:  Procedure Laterality Date  . [place]  . LASER ABLATION CONDYLOMA CERVICAL / VULVAR        Current Medications: Current Meds  Medication Sig  . aspirin EC 81 MG tablet Take 1 tablet (81 mg total) by mouth daily.  [name] atorvastatin (LIPITOR) 10 MG tablet Take 1 tablet (10 mg total) by mouth daily.  [name] estradiol (VIVELLE-DOT) 0.05 MG/24HR patch Place 1 patch (0.05 mg total) onto the skin 2 (two) times a week.  . meloxicam (MOBIC) 15 MG tablet One tab PO qAM with a meal for 2 weeks, then daily prn pain.  . metoprolol succinate (TOPROL XL) 25 MG 24 hr tablet Take 1 tablet (25 mg total) by mouth daily.  . progesterone (PROMETRIUM) 100 MG capsule Take 1 capsule (100 mg total) by mouth daily.     Allergies:   Sulfa antibiotics   Social History   Socioeconomic History  . Marital status: Unknown    Spouse name: Not on file  . Number of children: Not on file  . Years of education: Not on file  . Highest education level: Not on file  Occupational History  . Occupation: Radiation protection practitioner  Tobacco Use  . Smoking status: Never Smoker  . Smokeless tobacco: Never Used  Vaping Use  . Vaping Use: Never used  Substance and Sexual Activity  . Alcohol use: Yes    Comment: Rarely, drinks wine  . Drug use: Never  . Sexual activity: Yes    Birth control/protection: Post-menopausal, Surgical  Other Topics Concern  . Not on file  Social History Narrative  . Not on file   Social Determinants of Health   Financial Resource Strain:   . Difficulty of Paying Living Expenses: Not on file  Food Insecurity:   . Worried About Charity fundraiser in the Last Year: Not on file  . Ran Out of Food in the Last Year: Not on file  Transportation Needs:   . Lack of Transportation (Medical): Not on file  . Lack of Transportation (Non-Medical): Not on file  Physical Activity:   . Days of Exercise per Week: Not on file  . Minutes of Exercise per Session: Not on file  Stress:   . Feeling of Stress : Not on file  Social Connections:   . Frequency of Communication with Friends and Family: Not on file  .  Frequency of Social Gatherings with Friends and Family: Not on file  . Attends Religious Services: Not on file  . Active Member of Clubs or Organizations: Not on file  . Attends Archivist Meetings: Not on file  . Marital Status: Not on file     Family History: The patient's family history includes Breast cancer in her maternal aunt and maternal grandmother; Kidney cancer in her brother; Lymphoma in her maternal aunt and mother. ROS:   Please see the history of present illness.    All 14 point review of systems negative except as described per history of present illness  EKGs/Labs/Other Studies Reviewed:      Recent Labs: 03/06/2019: ALT 18; BUN 19; Creat 0.72; Hemoglobin 14.8; Platelets 248; Potassium 4.1; Sodium 140; TSH 0.50  Recent Lipid Panel    Component Value Date/Time   CHOL 197 03/06/2019 1102   TRIG 76 03/06/2019 1102   HDL 79 03/06/2019 1102   CHOLHDL 2.5 03/06/2019 1102   LDLCALC 101 (H) 03/06/2019 1102    Physical Exam:    VS:  BP 108/66   Pulse 66   Ht 5\' 7"  (1.702 m)   Wt 174 lb 1.9 oz (79 kg)   SpO2 99%   BMI 27.27 kg/m     Wt Readings from Last 3 Encounters:  09/22/19 174 lb 1.9 oz (79 kg)  06/16/19 172 lb (78 kg)  04/23/19 175 lb (79.4 kg)     GEN:  Well nourished, well developed in no acute distress HEENT: Normal NECK: No JVD; No carotid bruits LYMPHATICS: No lymphadenopathy CARDIAC: RRR, no murmurs, no rubs, no gallops RESPIRATORY:  Clear to auscultation without rales, wheezing or rhonchi  ABDOMEN: Soft, non-tender, non-distended MUSCULOSKELETAL:  No edema; No deformity  SKIN: Warm and dry LOWER EXTREMITIES: no swelling NEUROLOGIC:  Alert and oriented x 3 PSYCHIATRIC:  Normal affect   ASSESSMENT:    1. Angina pectoris ([place])   2. Dyspnea on exertion   3. Dyslipidemia    PLAN:    In order of problems listed above:  1. Stable angina pectoris Canadian classification 1, doing well. On appropriate medication the key is risk  factors modification she is on antiplatelet agent in form of aspirin, also cholesterol medication we will check her fasting lipid profile today. Intensive antianginal medication she is on a Toprol 25 does not want to increase it, her blood pressure is slightly soft therefore I will not push towards increasing the dose of this medication, she also take nitroglycerin on as-needed basis, she did not have any to take it lately. 2. Dyslipidemia: Lipitor 10 mg daily which is moderate intensity statin, will do fasting lipid profile today. 3.  Dyspnea on exertion: Stable no new issues.   Medication Adjustments/Labs and Tests Ordered: Current medicines are reviewed at length with the patient today.  Concerns regarding medicines are outlined above.  No orders of the defined types were placed in this encounter.  Medication changes: No orders of the defined types were placed in this encounter.   Signed, Park Liter, MD, [facility] 09/22/2019 8:25 AM    [place]

## 2019-09-23 LAB — LIPID PANEL
Chol/HDL Ratio: 2 ratio (ref 0.0–4.4)
Cholesterol, Total: 130 mg/dL (ref 100–199)
HDL: 66 mg/dL (ref >39)
LDL Chol Calc (NIH): 54 mg/dL (ref 0–99)
Triglycerides: 40 mg/dL (ref 0–149)
VLDL Cholesterol Cal: 10 mg/dL (ref 5–40)

## 2019-09-24 ENCOUNTER — Other Ambulatory Visit: Payer: Self-pay

## 2019-09-24 ENCOUNTER — Ambulatory Visit (INDEPENDENT_AMBULATORY_CARE_PROVIDER_SITE_OTHER): Payer: 59

## 2019-09-24 ENCOUNTER — Ambulatory Visit (INDEPENDENT_AMBULATORY_CARE_PROVIDER_SITE_OTHER): Payer: 59 | Admitting: Sports Medicine

## 2019-09-24 DIAGNOSIS — M654 Radial styloid tenosynovitis [de Quervain]: Secondary | ICD-10-CM

## 2019-09-24 DIAGNOSIS — Z1231 Encounter for screening mammogram for malignant neoplasm of breast: Secondary | ICD-10-CM

## 2019-09-24 NOTE — Progress Notes (Signed)
    Procedures performed today:    Procedure: Real-time Ultrasound Guided injection of the right first extensor compartment Device: Samsung HS60  Verbal informed consent obtained.  Time-out conducted.  Noted no overlying erythema, induration, or other signs of local infection.  Skin prepped in a sterile fashion.  Local anesthesia: Topical Ethyl chloride.  With sterile technique and under real time ultrasound guidance: 1 cc Kenalog 40, 1 cc lidocaine, 1 cc bupivacaine injected easily Completed without difficulty  Pain immediately resolved suggesting accurate placement of the medication.  Advised to call if fevers/chills, erythema, induration, drainage, or persistent bleeding.  Images permanently stored and available for review in PACS.  Impression: Technically successful ultrasound guided injection.  Independent interpretation of notes and tests performed by another provider:   None.  Brief History, Exam, Impression, and Recommendations:    De Quervain's tenosynovitis, right Aniyha returns, for 4 weeks she has worn her thumb spica brace, taken meloxicam, do her home rehab exercises, unfortunately she is not significantly better. She still has a positive Finkelstein sign and pain running along the distribution of the first extensor compartment, today we did a first extensor compartment injection, return to see me in a month. Discontinue spica brace tomorrow.    ___________________________________________ Gwen Her. Dianah Field, M.D., ABFM., CAQSM. Primary Care and [place] Instructor of [place] [facility]

## 2019-09-24 NOTE — Assessment & Plan Note (Signed)
Billiejo returns, for 4 weeks she has worn her thumb spica brace, taken meloxicam, do her home rehab exercises, unfortunately she is not significantly better. She still has a positive Finkelstein sign and pain running along the distribution of the first extensor compartment, today we did a first extensor compartment injection, return to see me in a month. Discontinue spica brace tomorrow.

## 2019-10-08 ENCOUNTER — Ambulatory Visit: Payer: 59

## 2019-10-24 ENCOUNTER — Ambulatory Visit: Payer: 59 | Admitting: Sports Medicine

## 2019-11-13 ENCOUNTER — Other Ambulatory Visit: Payer: Self-pay

## 2019-11-13 ENCOUNTER — Ambulatory Visit (INDEPENDENT_AMBULATORY_CARE_PROVIDER_SITE_OTHER): Payer: 59 | Admitting: Obstetrics and Gynecology

## 2019-11-13 ENCOUNTER — Encounter: Payer: Self-pay | Admitting: Obstetrics and Gynecology

## 2019-11-13 ENCOUNTER — Other Ambulatory Visit (HOSPITAL_COMMUNITY)
Admission: RE | Admit: 2019-11-13 | Discharge: 2019-11-13 | Disposition: A | Discharge status: Home / Self Care | Payer: 59 | Source: Ambulatory Visit | Attending: Obstetrics and Gynecology | Admitting: Obstetrics and Gynecology

## 2019-11-13 VITALS — BP 96/64 | HR 72 | Resp 16 | Ht 67.0 in | Wt 170.0 lb

## 2019-11-13 DIAGNOSIS — N951 Menopausal and female climacteric states: Secondary | ICD-10-CM | POA: Diagnosis not present

## 2019-11-13 DIAGNOSIS — B9689 Other specified bacterial agents as the cause of diseases classified elsewhere: Secondary | ICD-10-CM | POA: Insufficient documentation

## 2019-11-13 DIAGNOSIS — N898 Other specified noninflammatory disorders of vagina: Secondary | ICD-10-CM

## 2019-11-13 DIAGNOSIS — N950 Postmenopausal bleeding: Secondary | ICD-10-CM

## 2019-11-13 DIAGNOSIS — Z01419 Encounter for gynecological examination (general) (routine) without abnormal findings: Secondary | ICD-10-CM | POA: Diagnosis not present

## 2019-11-13 DIAGNOSIS — N760 Acute vaginitis: Secondary | ICD-10-CM | POA: Diagnosis not present

## 2019-11-13 DIAGNOSIS — B373 Candidiasis of vulva and vagina: Secondary | ICD-10-CM | POA: Insufficient documentation

## 2019-11-13 NOTE — Progress Notes (Signed)
GYNECOLOGY ANNUAL PREVENTATIVE CARE ENCOUNTER NOTE  Subjective:   Connie Massey is a 59 y.o. G106P2002 female here for a annual gynecologic exam. Current complaints: has several questions today.    Wants to stop HRT and know if there will be any side effects. Meds are more expensive and she has been on them several years now.   Has discomfort with intercourse, yeast infection symptoms  (itchiness, dry feeling) in the summer that went away with OTC meds. Has occasional itchiness and vaginal dryness, discomfort with intercourse.   Had some bleeding mid October, that then had spotting that lasted for a few weeks. She missed changing her Vivelle dot aroudn this time, did not miss progesterone.  Had a minor cramp on left pelvis that subsided after a few days.  Declines STI screen.   Gynecologic History No LMP recorded. Patient is postmenopausal. Contraception: post menopausal status Last Pap: 2020. Results: normal Last mammogram: [place] 1. Results: 2021 DEXA: has never had  Obstetric History OB History  Gravida Para Term Preterm AB Living  2 2 2     2   SAB TAB Ectopic Multiple Live Births               # Outcome Date GA Lbr Len/2nd Weight Sex Delivery Anes PTL Lv  2 Term           1 Term             Past Medical History:  Diagnosis Date  . Abnormal Pap smear of cervix   . Angina pectoris ([place]) 03/06/2019  . Chest pain 03/06/2019  . De Quervain's tenosynovitis, right 08/21/2019  . Dyslipidemia 03/24/2019  . Dyspnea on exertion 03/24/2019  . Episodic tension-type headache, not intractable 03/07/2019  . Hot flashes 11/08/2017  . HPV in female   . Insomnia 03/07/2019  . Menopause 11/08/2017  . Pain in anterior left upper extremity 03/06/2019  . Preventative health care 03/06/2019    Past Surgical History:  Procedure Laterality Date  . [place]  . LASER ABLATION CONDYLOMA CERVICAL / VULVAR      Current Outpatient Medications on File Prior to Visit  Medication Sig Dispense  Refill  . aspirin EC 81 MG tablet Take 1 tablet (81 mg total) by mouth daily. 90 tablet 3  . atorvastatin (LIPITOR) 10 MG tablet Take 1 tablet (10 mg total) by mouth daily. 90 tablet 3  . estradiol (VIVELLE-DOT) 0.05 MG/24HR patch Place 1 patch (0.05 mg total) onto the skin 2 (two) times a week. 24 patch 6  . metoprolol succinate (TOPROL XL) 25 MG 24 hr tablet Take 1 tablet (25 mg total) by mouth daily. 90 tablet 3  . progesterone (PROMETRIUM) 100 MG capsule Take 1 capsule (100 mg total) by mouth daily. 90 capsule 5   No current facility-administered medications on file prior to visit.    Allergies  Allergen Reactions  . Sulfa Antibiotics Hives and Rash    Social History   Socioeconomic History  . Marital status: Unknown    Spouse name: Not on file  . Number of children: Not on file  . Years of education: Not on file  . Highest education level: Not on file  Occupational History  . Occupation: Radiation protection practitioner  Tobacco Use  . Smoking status: Never Smoker  . Smokeless tobacco: Never Used  Vaping Use  . Vaping Use: Never used  Substance and Sexual Activity  . Alcohol use: Yes    Comment: Rarely, drinks wine  .  Drug use: Never  . Sexual activity: Yes    Birth control/protection: Post-menopausal, Surgical  Other Topics Concern  . Not on file  Social History Narrative  . Not on file   Social Determinants of Health   Financial Resource Strain:   . Difficulty of Paying Living Expenses: Not on file  Food Insecurity:   . Worried About Charity fundraiser in the Last Year: Not on file  . Ran Out of Food in the Last Year: Not on file  Transportation Needs:   . Lack of Transportation (Medical): Not on file  . Lack of Transportation (Non-Medical): Not on file  Physical Activity:   . Days of Exercise per Week: Not on file  . Minutes of Exercise per Session: Not on file  Stress:   . Feeling of Stress : Not on file  Social Connections:   . Frequency of Communication with Friends and  Family: Not on file  . Frequency of Social Gatherings with Friends and Family: Not on file  . Attends Religious Services: Not on file  . Active Member of Clubs or Organizations: Not on file  . Attends Archivist Meetings: Not on file  . Marital Status: Not on file  Intimate Partner Violence:   . Fear of Current or Ex-Partner: Not on file  . Emotionally Abused: Not on file  . Physically Abused: Not on file  . Sexually Abused: Not on file    Family History  Problem Relation Age of Onset  . Breast cancer Maternal Grandmother   . Lymphoma Mother   . Kidney cancer Brother   . Breast cancer Maternal Aunt   . Lymphoma Maternal Aunt     The following portions of the patient's history were reviewed and updated as appropriate: allergies, current medications, past family history, past medical history, past social history, past surgical history and problem list.  Review of Systems Pertinent items are noted in HPI.   Objective:  BP 96/64   Pulse 72   Resp 16   Ht 5\' 7"  (1.702 m)   Wt 170 lb (77.1 kg)   BMI 26.63 kg/m  CONSTITUTIONAL: Well-developed, well-nourished female in no acute distress.  HENT:  Normocephalic, atraumatic, External right and left ear normal. Oropharynx is clear and moist EYES: Conjunctivae and EOM are normal. Pupils are equal, round, and reactive to light. No scleral icterus.  NECK: Normal range of motion, supple, no masses.  Normal thyroid.  SKIN: Skin is warm and dry. No rash noted. Not diaphoretic. No erythema. No pallor. NEUROLOGIC: Alert and oriented to person, place, and time. Normal reflexes, muscle tone coordination. No cranial nerve deficit noted. PSYCHIATRIC: Normal mood and affect. Normal behavior. Normal judgment and thought content. CARDIOVASCULAR: Normal heart rate noted RESPIRATORY: Effort normal, no problems with respiration noted. BREASTS: Symmetric in size. No masses, skin changes, nipple drainage, or lymphadenopathy. 1 cm brown  seborrheic keratosis on left chest wall just superior to breast ABDOMEN: Soft, no distention noted.  No tenderness, rebound or guarding.  PELVIC: Normal appearing external genitalia; normal appearing vaginal mucosa and cervix.  Thick white discharge noted.  Pelvic cultures obtained. Normal uterine size, no other palpable masses, no uterine or adnexal tenderness. MUSCULOSKELETAL: Normal range of motion. No tenderness.  No cyanosis, clubbing, or edema.  2+ distal pulses.  Exam done with chaperone present.   Assessment and Plan:   1. Vaginal discharge Rule out infection - Cervicovaginal ancillary only( [place])  2. Well woman exam Healthy female exam  3. Vaginal dryness May worsen with discontinuing HRT, try vMagic and see if improvement  4. Menopausal symptoms Would like to discontinue HRT  If symptoms are bad, she will let us know and reconsider  5. Post-menopausal bleeding Recommend further workup  Needs [place] versus EMB, she will check on price and let us know which she would prefer to start with   Encouraged improvement in diet and exercise.  Declines STI screen. Mammogram UTD Referral for colonoscopy n/a  Routine preventative health maintenance measures emphasized. Please refer to After Visit Summary for other counseling recommendations.   Total face-to-face time with patient: 35 minutes. Over 50% of encounter was spent on counseling and coordination of care.   Feliz Beam, M.D. Attending Center for [employer] Fish farm manager)

## 2019-11-14 LAB — CERVICOVAGINAL ANCILLARY ONLY
Bacterial Vaginitis (gardnerella): POSITIVE — AB
Candida Glabrata: NEGATIVE
Candida Vaginitis: POSITIVE — AB
Comment: NEGATIVE
Comment: NEGATIVE
Comment: NEGATIVE

## 2019-11-17 ENCOUNTER — Telehealth: Payer: Self-pay | Admitting: *Deleted

## 2019-11-17 MED ORDER — METRONIDAZOLE 500 MG PO TABS: 500.0000 mg | ORAL_TABLET | Freq: Two times a day (BID) | ORAL | 0 refills | Status: DC

## 2019-11-17 MED ORDER — FLUCONAZOLE 150 MG PO TABS: 150.0000 mg | ORAL_TABLET | Freq: Once | ORAL | 0 refills | Status: AC

## 2019-11-17 NOTE — Telephone Encounter (Signed)
Pt called stating that she saw where she has BV and vaginal yeast.  She is requesting the RX be sent to [facility] in [place].  Flagyl and Diflucan sent to the pharmacy per protocol.

## 2019-12-02 ENCOUNTER — Ambulatory Visit (INDEPENDENT_AMBULATORY_CARE_PROVIDER_SITE_OTHER): Payer: 59 | Admitting: Medical-Surgical

## 2019-12-02 ENCOUNTER — Other Ambulatory Visit: Payer: Self-pay

## 2019-12-02 ENCOUNTER — Encounter: Payer: Self-pay | Admitting: Medical-Surgical

## 2019-12-02 VITALS — BP 129/77 | HR 64 | Temp 98.2°F | Ht 67.0 in | Wt 172.5 lb

## 2019-12-02 DIAGNOSIS — N6320 Unspecified lump in the left breast, unspecified quadrant: Secondary | ICD-10-CM | POA: Diagnosis not present

## 2019-12-02 NOTE — Progress Notes (Signed)
Subjective:    CC: Lump under left arm  HPI: Pleasant 59 year old female presenting today for evaluation of a lump that she found under her left arm approximately 3 weeks ago.  Notes that she was just seen by OB/GYN about 3 weeks ago.  At that time, she neither she nor the OB/GYN noticed the lump.  Approximately 3 weeks ago, she was in her shower washing up and incidentally found a small lump to the left lateral breast near the axilla.  The lump is painless without itching or redness.  Admits that she did try squeezing it but nothing came out.  She used Cortaid topically when she originally thought it was a bug bite but this did not help.  On looking closer, she noticed that there was no center indication of a bug bite.  She was using hormone replacement but has discontinued both the progesterone and the estradiol since her OB/GYN appointment.  Last mammogram 09/2019, normal.  Family history of lymphoma, breast cancer.  I reviewed the past medical history, family history, social history, surgical history, and allergies today and no changes were needed.  Please see the problem list section below in epic for further details.  Past Medical History: Past Medical History:  Diagnosis Date  . Abnormal Pap smear of cervix   . Angina pectoris ([place]) 03/06/2019  . Chest pain 03/06/2019  . De Quervain's tenosynovitis, right 08/21/2019  . Dyslipidemia 03/24/2019  . Dyspnea on exertion 03/24/2019  . Episodic tension-type headache, not intractable 03/07/2019  . Hot flashes 11/08/2017  . HPV in female   . Insomnia 03/07/2019  . Menopause 11/08/2017  . Pain in anterior left upper extremity 03/06/2019  . Preventative health care 03/06/2019   Past Surgical History: Past Surgical History:  Procedure Laterality Date  . [place]  . LASER ABLATION CONDYLOMA CERVICAL / VULVAR     Social History: Social History   Socioeconomic History  . Marital status: Unknown    Spouse name: Not on file  . Number of  children: Not on file  . Years of education: Not on file  . Highest education level: Not on file  Occupational History  . Occupation: Radiation protection practitioner  Tobacco Use  . Smoking status: Never Smoker  . Smokeless tobacco: Never Used  Vaping Use  . Vaping Use: Never used  Substance and Sexual Activity  . Alcohol use: Yes    Comment: Rarely, drinks wine  . Drug use: Never  . Sexual activity: Yes    Birth control/protection: Post-menopausal, Surgical  Other Topics Concern  . Not on file  Social History Narrative  . Not on file   Social Determinants of Health   Financial Resource Strain:   . Difficulty of Paying Living Expenses: Not on file  Food Insecurity:   . Worried About Charity fundraiser in the Last Year: Not on file  . Ran Out of Food in the Last Year: Not on file  Transportation Needs:   . Lack of Transportation (Medical): Not on file  . Lack of Transportation (Non-Medical): Not on file  Physical Activity:   . Days of Exercise per Week: Not on file  . Minutes of Exercise per Session: Not on file  Stress:   . Feeling of Stress : Not on file  Social Connections:   . Frequency of Communication with Friends and Family: Not on file  . Frequency of Social Gatherings with Friends and Family: Not on file  . Attends Religious Services: Not on  file  . Active Member of Clubs or Organizations: Not on file  . Attends Archivist Meetings: Not on file  . Marital Status: Not on file   Family History: Family History  Problem Relation Age of Onset  . Breast cancer Maternal Grandmother   . Lymphoma Mother   . Kidney cancer Brother   . Breast cancer Maternal Aunt   . Lymphoma Maternal Aunt    Allergies: Allergies  Allergen Reactions  . Sulfa Antibiotics Hives and Rash   Medications: See med rec.  Review of Systems: See HPI for pertinent positives and negatives.   Objective:    General: Well Developed, well nourished, and in no acute distress.  Neuro: Alert and  oriented x3.  HEENT: Normocephalic, atraumatic.  Skin: Warm and dry. Cardiac: Regular rate and rhythm, no murmurs rubs or gallops, no lower extremity edema.  Respiratory: Clear to auscultation bilaterally. Not using accessory muscles, speaking in full sentences.   Impression and Recommendations:    1. Left breast lump Small 51mm papule on the skin of the left lateral breast inferior to the axilla.  Palpation of the papule showing a slight firmness that extends into the dermis and broadens slightly at the base.  Questionable dermatological etiology versus breast cyst.  With her family history of lymphoma and breast cancer in mother as well as her maternal aunt, ordering a diagnostic mammogram of the left breast along with ultrasound to further evaluate the area.  She would like these sent to the breast imaging center in [place] as it is more convenient to her work. - US BREAST COMPLETE UNI LEFT INC AXILLA; Future - MM Digital Diagnostic Unilat L; Future  Return if symptoms worsen or fail to improve. ___________________________________________ Clearnce Sorrel, DNP, APRN, FNP-BC Primary Care and [place]

## 2019-12-08 ENCOUNTER — Telehealth: Payer: Self-pay

## 2019-12-08 NOTE — Telephone Encounter (Signed)
Pt called to check on the status of the referral to the [facility] at [facility] in [place]. Joy placed the orders on 12/02/2019 and pt states she was told if she had not heard anything back within a week to call and check the status

## 2019-12-08 NOTE — Telephone Encounter (Signed)
We have reprinted, signed and faxed the orders over again. Hopefully this will encourage them to get in touch with her to get her scheduled.

## 2019-12-10 NOTE — Telephone Encounter (Signed)
Called and spoke with pt and asked if they have called her yet for scheduling and she said they have not. I told her that the referral was printed, signed, and re-faxed 2 days ago so they should have gotten in touch with her. She asked if she could call herself to get scheduled and I told her that she could call and if they say anything about a referral to tell them that we have sent it over twice. I told her that if she has any further problems or complications to give me a call back.

## 2019-12-24 LAB — HM MAMMOGRAPHY

## 2020-03-11 ENCOUNTER — Encounter: Payer: Self-pay | Admitting: Medical-Surgical

## 2020-03-17 ENCOUNTER — Ambulatory Visit: Payer: 59 | Admitting: Cardiology

## 2020-04-21 ENCOUNTER — Ambulatory Visit (INDEPENDENT_AMBULATORY_CARE_PROVIDER_SITE_OTHER): Payer: 59 | Admitting: Cardiology

## 2020-04-21 ENCOUNTER — Encounter: Payer: Self-pay | Admitting: Cardiology

## 2020-04-21 ENCOUNTER — Other Ambulatory Visit: Payer: Self-pay

## 2020-04-21 VITALS — BP 106/64 | HR 69 | Ht 67.0 in | Wt 169.0 lb

## 2020-04-21 DIAGNOSIS — R0609 Other forms of dyspnea: Secondary | ICD-10-CM

## 2020-04-21 DIAGNOSIS — E785 Hyperlipidemia, unspecified: Secondary | ICD-10-CM | POA: Diagnosis not present

## 2020-04-21 DIAGNOSIS — R0600 Dyspnea, unspecified: Secondary | ICD-10-CM | POA: Diagnosis not present

## 2020-04-21 DIAGNOSIS — I209 Angina pectoris, unspecified: Secondary | ICD-10-CM

## 2020-04-21 MED ORDER — ATORVASTATIN CALCIUM 10 MG PO TABS
10.0000 mg | ORAL_TABLET | Freq: Every day | ORAL | 3 refills | Status: DC
Start: 2020-04-21 — End: 2021-09-20

## 2020-04-21 MED ORDER — METOPROLOL SUCCINATE ER 25 MG PO TB24
25.0000 mg | ORAL_TABLET | Freq: Every day | ORAL | 3 refills | Status: DC
Start: 2020-04-21 — End: 2021-09-20

## 2020-04-21 NOTE — Progress Notes (Signed)
Cardiology Office Note:    Date:  04/21/2020   ID:  [name], DOB 04-26-1960, MRN [id number]  PCP:  Samuel Bouche, NP  Cardiologist:  Jenne Campus, MD    Referring MD: Samuel Bouche, NP   Chief Complaint  Patient presents with  . Follow-up  I am doing fine  History of Present Illness:    Connie Massey is a 60 y.o. female with past medical history significant for chest exertional chest pain which was very suspicion for coronary artery disease she did have stress test done she walk on the treadmill for 10 minutes developed some ST segment depressions after that we had a long discussion about what to do next which we consider either coronary CT angio or cardiac catheterization.  She did not want to do this she still very active she walks on the regular basis she exercise she does all different kind of exercise have no difficulty doing it denies having any chest pain tightness squeezing pressure burning chest likely however, after she got the booster of COVID-19 vaccine she did have some chest pain.  Past Medical History:  Diagnosis Date  . Abnormal Pap smear of cervix   . Angina pectoris ([place]) 03/06/2019  . Chest pain 03/06/2019  . De Quervain's tenosynovitis, right 08/21/2019  . Dyslipidemia 03/24/2019  . Dyspnea on exertion 03/24/2019  . Episodic tension-type headache, not intractable 03/07/2019  . Hot flashes 11/08/2017  . HPV in female   . Insomnia 03/07/2019  . Menopause 11/08/2017  . Pain in anterior left upper extremity 03/06/2019  . Preventative health care 03/06/2019    Past Surgical History:  Procedure Laterality Date  . [place]  . LASER ABLATION CONDYLOMA CERVICAL / VULVAR      Current Medications: Current Meds  Medication Sig  . aspirin EC 81 MG tablet Take 1 tablet (81 mg total) by mouth daily.  [name] atorvastatin (LIPITOR) 10 MG tablet Take 1 tablet (10 mg total) by mouth daily.  . metoprolol succinate (TOPROL XL) 25 MG 24 hr tablet Take 1 tablet (25 mg total) by mouth  daily.     Allergies:   Sulfa antibiotics   Social History   Socioeconomic History  . Marital status: Unknown    Spouse name: Not on file  . Number of children: Not on file  . Years of education: Not on file  . Highest education level: Not on file  Occupational History  . Occupation: Radiation protection practitioner  Tobacco Use  . Smoking status: Never Smoker  . Smokeless tobacco: Never Used  Vaping Use  . Vaping Use: Never used  Substance and Sexual Activity  . Alcohol use: Yes    Comment: Rarely, drinks wine  . Drug use: Never  . Sexual activity: Yes    Birth control/protection: Post-menopausal, Surgical  Other Topics Concern  . Not on file  Social History Narrative  . Not on file   Social Determinants of Health   Financial Resource Strain: Not on file  Food Insecurity: Not on file  Transportation Needs: Not on file  Physical Activity: Not on file  Stress: Not on file  Social Connections: Not on file     Family History: The patient's family history includes Breast cancer in her maternal aunt and maternal grandmother; Kidney cancer in her brother; Lymphoma in her maternal aunt and mother. ROS:   Please see the history of present illness.    All 14 point review of systems negative except as described per history of present  illness  EKGs/Labs/Other Studies Reviewed:      Recent Labs: No results found for requested labs within last 8760 hours.  Recent Lipid Panel    Component Value Date/Time   CHOL 130 09/22/2019 0849   TRIG 40 09/22/2019 0849   HDL 66 09/22/2019 0849   CHOLHDL 2.0 09/22/2019 0849   CHOLHDL 2.5 03/06/2019 1102   LDLCALC 54 09/22/2019 0849   LDLCALC 101 (H) 03/06/2019 1102    Physical Exam:    VS:  BP 106/64 (BP Location: Right Arm, Patient Position: Sitting)   Pulse 69   Ht 5\' 7"  (1.702 m)   Wt 169 lb (76.7 kg)   SpO2 96%   BMI 26.47 kg/m     Wt Readings from Last 3 Encounters:  04/21/20 169 lb (76.7 kg)  12/02/19 172 lb 8 oz (78.2 kg)  11/13/19  170 lb (77.1 kg)     GEN:  Well nourished, well developed in no acute distress HEENT: Normal NECK: No JVD; No carotid bruits LYMPHATICS: No lymphadenopathy CARDIAC: RRR, no murmurs, no rubs, no gallops RESPIRATORY:  Clear to auscultation without rales, wheezing or rhonchi  ABDOMEN: Soft, non-tender, non-distended MUSCULOSKELETAL:  No edema; No deformity  SKIN: Warm and dry LOWER EXTREMITIES: no swelling NEUROLOGIC:  Alert and oriented x 3 PSYCHIATRIC:  Normal affect   ASSESSMENT:    1. Angina pectoris ([place])   2. Dyspnea on exertion   3. Dyslipidemia    PLAN:    In order of problems listed above:  1. Angina pectoris history were very suspicious before however she did 10 minutes treadmill test with some EKG changes no chest pain, she does not want to have any more evaluation done.  She says she is doing fine and truly she exercised on the regular basis she had no problem doing it. 2. Dyspnea on exertion stable doing well. 3. Dyslipidemia offer her cholesterol checked but she said she is going to do it next time.   Medication Adjustments/Labs and Tests Ordered: Current medicines are reviewed at length with the patient today.  Concerns regarding medicines are outlined above.  No orders of the defined types were placed in this encounter.  Medication changes: No orders of the defined types were placed in this encounter.   Signed, Park Liter, MD, [facility] 04/21/2020 3:23 PM    [facility]

## 2020-04-21 NOTE — Patient Instructions (Signed)

## 2020-04-21 NOTE — Addendum Note (Signed)
Addended by: Senaida Ores on: 04/21/2020 03:32 PM   Modules accepted: Orders

## 2020-04-26 ENCOUNTER — Ambulatory Visit: Payer: 59 | Admitting: Cardiology

## 2020-04-30 ENCOUNTER — Ambulatory Visit: Payer: 59 | Admitting: Cardiology

## 2020-10-22 ENCOUNTER — Other Ambulatory Visit: Payer: Self-pay | Admitting: Obstetrics and Gynecology

## 2020-10-22 DIAGNOSIS — Z1231 Encounter for screening mammogram for malignant neoplasm of breast: Secondary | ICD-10-CM

## 2020-11-18 ENCOUNTER — Ambulatory Visit (INDEPENDENT_AMBULATORY_CARE_PROVIDER_SITE_OTHER): Payer: 59

## 2020-11-18 ENCOUNTER — Other Ambulatory Visit: Payer: Self-pay

## 2020-11-18 DIAGNOSIS — Z1231 Encounter for screening mammogram for malignant neoplasm of breast: Secondary | ICD-10-CM | POA: Diagnosis not present

## 2020-11-23 ENCOUNTER — Encounter: Payer: Self-pay | Admitting: Obstetrics and Gynecology

## 2020-11-23 ENCOUNTER — Other Ambulatory Visit: Payer: Self-pay

## 2020-11-23 ENCOUNTER — Ambulatory Visit (INDEPENDENT_AMBULATORY_CARE_PROVIDER_SITE_OTHER): Payer: 59 | Admitting: Obstetrics and Gynecology

## 2020-11-23 VITALS — BP 101/65 | HR 74 | Ht 67.0 in | Wt 163.0 lb

## 2020-11-23 DIAGNOSIS — Z01419 Encounter for gynecological examination (general) (routine) without abnormal findings: Secondary | ICD-10-CM | POA: Diagnosis not present

## 2020-11-23 NOTE — Progress Notes (Signed)
GYNECOLOGY ANNUAL PREVENTATIVE CARE ENCOUNTER NOTE  History:     Connie Massey is a 60 y.o. G1P2002 female here for a routine annual gynecologic exam.  Current complaints: vaginal dryness; occasional. Has tried OTC medications and lubrication which helps some.   Denies abnormal vaginal bleeding, discharge, pelvic pain, or other gynecologic concerns.    Gynecologic History No LMP recorded. Patient is postmenopausal. Contraception: none Last Pap: 2020 Result was normal with negative HPV Last Mammogram: 2022.   Result was normal Last Colonoscopy: Cologuard or similar, 2 months ago: negative   Obstetric History OB History  Gravida Para Term Preterm AB Living  2 2 2     2   SAB IAB Ectopic Multiple Live Births               # Outcome Date GA Lbr Len/2nd Weight Sex Delivery Anes PTL Lv  2 Term           1 Term             Past Medical History:  Diagnosis Date   Abnormal Pap smear of cervix    Angina pectoris ([place]) 03/06/2019   Chest pain 03/06/2019   De Quervain's tenosynovitis, right 08/21/2019   Dyslipidemia 03/24/2019   Dyspnea on exertion 03/24/2019   Episodic tension-type headache, not intractable 03/07/2019   Hot flashes 11/08/2017   HPV in female    Insomnia 03/07/2019   Menopause 11/08/2017   Pain in anterior left upper extremity 03/06/2019   Preventative health care 03/06/2019    Past Surgical History:  Procedure Laterality Date   HERNIA REPAIR  1963   LASER ABLATION CONDYLOMA CERVICAL / VULVAR      Current Outpatient Medications on File Prior to Visit  Medication Sig Dispense Refill   aspirin EC 81 MG tablet Take 1 tablet (81 mg total) by mouth daily. 90 tablet 3   atorvastatin (LIPITOR) 10 MG tablet Take 1 tablet (10 mg total) by mouth daily. (Patient not taking: Reported on 11/23/2020) 90 tablet 3   metoprolol succinate (TOPROL XL) 25 MG 24 hr tablet Take 1 tablet (25 mg total) by mouth daily. (Patient not taking: Reported on 11/23/2020) 90 tablet 3   No current  facility-administered medications on file prior to visit.    Allergies  Allergen Reactions   Sulfa Antibiotics Hives and Rash    Social History:  reports that she has never smoked. She has never used smokeless tobacco. She reports current alcohol use. She reports that she does not use drugs.  Family History  Problem Relation Age of Onset   Breast cancer Maternal Grandmother    Lymphoma Mother    Kidney cancer Brother    Breast cancer Maternal Aunt    Lymphoma Maternal Aunt     The following portions of the patient's history were reviewed and updated as appropriate: allergies, current medications, past family history, past medical history, past social history, past surgical history and problem list.  Review of Systems Pertinent items noted in HPI and remainder of comprehensive ROS otherwise negative.  Physical Exam:  BP 101/65   Pulse 74   Ht 5\' 7"  (1.702 m)   Wt 163 lb (73.9 kg)   BMI 25.53 kg/m  CONSTITUTIONAL: Well-developed, well-nourished female in no acute distress.  HENT:  Normocephalic, atraumatic, External right and left ear normal.  EYES: Conjunctivae and EOM are normal. Pupils are equal, round, and reactive to light. No scleral icterus.  NECK: Normal range of motion, supple, no masses.  Normal thyroid.  SKIN: Skin is warm and dry. No rash noted. Not diaphoretic. No erythema. No pallor. MUSCULOSKELETAL: Normal range of motion. No tenderness.  No cyanosis, clubbing, or edema. NEUROLOGIC: Alert and oriented to person, place, and time. Normal reflexes, muscle tone coordination.  PSYCHIATRIC: Normal mood and affect. Normal behavior. Normal judgment and thought content. CARDIOVASCULAR: Normal heart rate noted, regular rhythm RESPIRATORY: Clear to auscultation bilaterally. Effort and breath sounds normal, no problems with respiration noted. BREASTS: Symmetric in size. No masses, tenderness, skin changes, nipple drainage, or lymphadenopathy bilaterally. Performed in the  presence of a chaperone. ABDOMEN: Soft, no distention noted.  No tenderness, rebound or guarding.  PELVIC: Normal appearing external genitalia and urethral meatus.  Normal uterine size, no other palpable masses, no uterine or adnexal tenderness.  Performed in the presence of a chaperone.   Assessment and Plan:   Women's annual routine gynecological examination   Pap not due. She may be interested in vaginal estrogen, she will My chart message me if she decides to trial Samples of Replens given F/u in 1 year Lubrication always with intercourse.   Mammogram completed  Cologuard completed  Routine preventative health maintenance measures emphasized. Please refer to After Visit Summary for other counseling recommendations.    Maniyah Moller, Artist Pais, [place] for [employer], [place]

## 2020-11-23 NOTE — Progress Notes (Signed)
Pt declines flu shot Last mamm- 11/18/20- normal Last pap- 11/07/18- negative

## 2020-11-23 NOTE — Addendum Note (Signed)
Addended by: Noni Saupe I on: 11/23/2020 09:22 AM   Modules accepted: Level of Service

## 2021-09-20 ENCOUNTER — Ambulatory Visit: Payer: BC Managed Care – PPO | Admitting: Medical-Surgical

## 2021-09-20 ENCOUNTER — Encounter: Payer: Self-pay | Admitting: Medical-Surgical

## 2021-09-20 VITALS — BP 129/81 | HR 53 | Resp 20 | Ht 67.0 in | Wt 158.8 lb

## 2021-09-20 DIAGNOSIS — Z0000 Encounter for general adult medical examination without abnormal findings: Secondary | ICD-10-CM | POA: Diagnosis not present

## 2021-09-20 DIAGNOSIS — L659 Nonscarring hair loss, unspecified: Secondary | ICD-10-CM | POA: Diagnosis not present

## 2021-09-20 DIAGNOSIS — R450 Nervousness: Secondary | ICD-10-CM

## 2021-09-20 DIAGNOSIS — G8929 Other chronic pain: Secondary | ICD-10-CM

## 2021-09-20 DIAGNOSIS — Z8349 Family history of other endocrine, nutritional and metabolic diseases: Secondary | ICD-10-CM

## 2021-09-20 DIAGNOSIS — R519 Headache, unspecified: Secondary | ICD-10-CM

## 2021-09-20 DIAGNOSIS — S90229A Contusion of unspecified lesser toe(s) with damage to nail, initial encounter: Secondary | ICD-10-CM

## 2021-09-20 NOTE — Progress Notes (Signed)
Established Patient Office Visit  Subjective   Patient ID: Connie Massey, female   DOB: 03-06-1960 Age: 61 y.o. MRN: [id number]   Chief Complaint  Patient presents with   Thyroid Problem    HPI Pleasant 61 year old female presenting today for the following:  Thyroid concerns: Notes that she does have a family history of thyroid issues in her father.  She is having spells that occur if she does not eat and involve brain fog and stomach flutters.  She is also noted more headaches, some of them on weight gain the last month.  Reports increase in hair loss and has noted an intermittent fine hand tremor over the past couple of days.  She does admit to having intermittent hot flashes with lightheadedness but these usually resolve on their own and seem to be related to hormonal fluctuations.  Has had no significant weight changes.  He notes an increase in nocturia.  Would like to have her thyroid labs checked.  Toenails: Recently had injuries to her right and left great toes as well as the right second toe.  Has blackened discoloration in various degrees to all 3 toenails.  Some of the discoloration has been present since the injuries in July but her great toe issues are newer due to an incident with walking a very large dog.  The toenail is not painful and she has no other symptoms outside of the discoloration.   Objective:    Vitals:   09/20/21 0916  BP: 129/81  Pulse: (!) 53  Resp: 20  Height: '5\' 7"'$  (1.702 m)  Weight: 158 lb 12.8 oz (72 kg)  SpO2: 100%  BMI (Calculated): 24.87   Physical Exam Vitals and nursing note reviewed.  Constitutional:      General: She is not in acute distress.    Appearance: Normal appearance. She is not ill-appearing.  HENT:     Head: Normocephalic and atraumatic.  Cardiovascular:     Rate and Rhythm: Normal rate and regular rhythm.     Pulses: Normal pulses.     Heart sounds: Normal heart sounds.  Pulmonary:     Effort: Pulmonary effort is normal. No  respiratory distress.     Breath sounds: Normal breath sounds. No wheezing, rhonchi or rales.  Skin:    General: Skin is warm and dry.     Findings: Bruising (See clinical photo below) present.  Neurological:     Mental Status: She is alert and oriented to person, place, and time.  Psychiatric:        Mood and Affect: Mood normal.        Behavior: Behavior normal.        Thought Content: Thought content normal.        Judgment: Judgment normal.       No results found for this or any previous visit (from the past 24 hour(s)).    The 10-year ASCVD risk score (Arnett DK, et al., 2019) is: 2.8%   Values used to calculate the score:     Age: 12 years     Sex: Female     Is Non-Hispanic African American: No     Diabetic: No     Tobacco smoker: No     Systolic Blood Pressure: 003 mmHg     Is BP treated: No     HDL Cholesterol: 66 mg/dL     Total Cholesterol: 130 mg/dL   Assessment & Plan:   1. Hair loss Discussed the multiple etiologies of  hair loss.  This could be age-related, nutrition, medication side effect, anemia/iron deficiency, or thyroid.  We will check her thyroid panel today given her family history and presenting symptoms. - Thyroid Panel With TSH  2. Jittery feeling Checking hemoglobin A1c. - Hemoglobin A1c  3. Chronic nonintractable headache, unspecified headache type Discussed prevention as well as abortive options for chronic headaches.  She is noted on taking medications and like to manage these naturally for now.  4. Family history of thyroid disease Check a thyroid panel. - Thyroid Panel With TSH  5. Preventative health care Checking labs as below. - CBC with Differential/Platelet - COMPLETE METABOLIC PANEL WITH GFR - Lipid panel  6. Subungual hematoma of toenail, initial encounter Discussed various options.  She will likely see an improvement in her symptoms over the next several months however it could take up to 1 year to grow out completely.   Offered referral to podiatry to discuss treatment options and recommendations.  She declines today but will let me know if she changes her mind in the future.   Return if symptoms worsen or fail to improve.  ___________________________________________ Clearnce Sorrel, DNP, APRN, FNP-BC [facility] [place]

## 2021-09-21 LAB — COMPLETE METABOLIC PANEL WITH GFR
AG Ratio: 1.4 (calc) (ref 1.0–2.5)
ALT: 18 U/L (ref 6–29)
AST: 21 U/L (ref 10–35)
Albumin: 4.2 g/dL (ref 3.6–5.1)
Alkaline phosphatase (APISO): 59 U/L (ref 37–153)
BUN: 13 mg/dL (ref 7–25)
CO2: 27 mmol/L (ref 20–32)
Calcium: 9.5 mg/dL (ref 8.6–10.4)
Chloride: 104 mmol/L (ref 98–110)
Creat: 0.73 mg/dL (ref 0.50–1.05)
Globulin: 3 g/dL (calc) (ref 1.9–3.7)
Glucose, Bld: 94 mg/dL (ref 65–99)
Potassium: 4.5 mmol/L (ref 3.5–5.3)
Sodium: 140 mmol/L (ref 135–146)
Total Bilirubin: 0.5 mg/dL (ref 0.2–1.2)
Total Protein: 7.2 g/dL (ref 6.1–8.1)
eGFR: 93 mL/min/{1.73_m2} (ref >=60)

## 2021-09-21 LAB — CBC WITH DIFFERENTIAL/PLATELET
Absolute Monocytes: 534 cells/uL (ref 200–950)
Basophils Absolute: 10 cells/uL (ref 0–200)
Basophils Relative: 0.2 %
Eosinophils Absolute: 59 cells/uL (ref 15–500)
Eosinophils Relative: 1.2 %
HCT: 42 % (ref 35.0–45.0)
Hemoglobin: 14 g/dL (ref 11.7–15.5)
Lymphs Abs: 1852 cells/uL (ref 850–3900)
MCH: 31.3 pg (ref 27.0–33.0)
MCHC: 33.3 g/dL (ref 32.0–36.0)
MCV: 94 fL (ref 80.0–100.0)
MPV: 9.7 fL (ref 7.5–12.5)
Monocytes Relative: 10.9 %
Neutro Abs: 2445 cells/uL (ref 1500–7800)
Neutrophils Relative %: 49.9 %
Platelets: 250 10*3/uL (ref 140–400)
RBC: 4.47 10*6/uL (ref 3.80–5.10)
RDW: 11.8 % (ref 11.0–15.0)
Total Lymphocyte: 37.8 %
WBC: 4.9 10*3/uL (ref 3.8–10.8)

## 2021-09-21 LAB — HEMOGLOBIN A1C
Hgb A1c MFr Bld: 5.4 % of total Hgb (ref <5.7)
Mean Plasma Glucose: 108 mg/dL
eAG (mmol/L): 6 mmol/L

## 2021-09-21 LAB — LIPID PANEL
Cholesterol: 195 mg/dL (ref <200)
HDL: 77 mg/dL (ref >=50)
LDL Cholesterol (Calc): 103 mg/dL (calc) — ABNORMAL HIGH
Non-HDL Cholesterol (Calc): 118 mg/dL (calc) (ref <130)
Total CHOL/HDL Ratio: 2.5 (calc) (ref <5.0)
Triglycerides: 67 mg/dL (ref <150)

## 2021-09-21 LAB — THYROID PANEL WITH TSH
Free Thyroxine Index: 2.6 (ref 1.4–3.8)
T3 Uptake: 30 % (ref 22–35)
T4, Total: 8.6 ug/dL (ref 5.1–11.9)
TSH: 0.58 mIU/L (ref 0.40–4.50)

## 2022-01-09 ENCOUNTER — Other Ambulatory Visit: Payer: Self-pay | Admitting: Obstetrics and Gynecology

## 2022-01-09 DIAGNOSIS — Z1231 Encounter for screening mammogram for malignant neoplasm of breast: Secondary | ICD-10-CM

## 2022-01-17 ENCOUNTER — Ambulatory Visit: Payer: 59 | Admitting: Obstetrics and Gynecology

## 2022-01-17 ENCOUNTER — Other Ambulatory Visit (HOSPITAL_COMMUNITY)
Admission: RE | Admit: 2022-01-17 | Discharge: 2022-01-17 | Disposition: A | Discharge status: Home / Self Care | Payer: 59 | Source: Ambulatory Visit | Attending: Obstetrics and Gynecology | Admitting: Obstetrics and Gynecology

## 2022-01-17 ENCOUNTER — Encounter: Payer: Self-pay | Admitting: Obstetrics and Gynecology

## 2022-01-17 VITALS — BP 126/78 | HR 76 | Ht 67.0 in | Wt 158.0 lb

## 2022-01-17 DIAGNOSIS — Z01419 Encounter for gynecological examination (general) (routine) without abnormal findings: Secondary | ICD-10-CM | POA: Insufficient documentation

## 2022-01-17 NOTE — Progress Notes (Signed)
GYNECOLOGY ANNUAL PREVENTATIVE CARE ENCOUNTER NOTE  History:     Connie Massey is a 62 y.o. G61P2002 female here for a routine annual gynecologic exam.  Current complaints: None. Vaginal dryness is manageable.    Denies abnormal vaginal bleeding, discharge, pelvic pain, problems with intercourse or other gynecologic concerns.    Gynecologic History No LMP recorded. Patient is postmenopausal. Contraception: post menopausal status Last Pap: 2020. Result was normal with negative HPV Last Mammogram: Scheduled for February 2024.    Obstetric History OB History  Gravida Para Term Preterm AB Living  '2 2 2     2  '$ SAB IAB Ectopic Multiple Live Births               # Outcome Date GA Lbr Len/2nd Weight Sex Delivery Anes PTL Lv  2 Term           1 Term             Past Medical History:  Diagnosis Date   Abnormal Pap smear of cervix    Angina pectoris ([place]) 03/06/2019   Chest pain 03/06/2019   De Quervain's tenosynovitis, right 08/21/2019   Dyslipidemia 03/24/2019   Dyspnea on exertion 03/24/2019   Episodic tension-type headache, not intractable 03/07/2019   Hot flashes 11/08/2017   HPV in female    Insomnia 03/07/2019   Menopause 11/08/2017   Pain in anterior left upper extremity 03/06/2019   Preventative health care 03/06/2019    Past Surgical History:  Procedure Laterality Date   HERNIA REPAIR  1963   LASER ABLATION CONDYLOMA CERVICAL / VULVAR      Current Outpatient Medications on File Prior to Visit  Medication Sig Dispense Refill   aspirin EC 81 MG tablet Take 1 tablet (81 mg total) by mouth daily. 90 tablet 3   No current facility-administered medications on file prior to visit.    Allergies  Allergen Reactions   Sulfa Antibiotics Hives and Rash    Social History:  reports that she has never smoked. She has never used smokeless tobacco. She reports current alcohol use. She reports that she does not use drugs.  Family History  Problem Relation Age of Onset   Breast cancer  Maternal Grandmother    Lymphoma Mother    Kidney cancer Brother    Breast cancer Maternal Aunt    Lymphoma Maternal Aunt     The following portions of the patient's history were reviewed and updated as appropriate: allergies, current medications, past family history, past medical history, past social history, past surgical history and problem list.  Review of Systems Pertinent items noted in HPI and remainder of comprehensive ROS otherwise negative.  Physical Exam:  BP 126/78   Pulse 76   Ht '5\' 7"'$  (1.702 m)   Wt 158 lb (71.7 kg)   BMI 24.75 kg/m  CONSTITUTIONAL: Well-developed, well-nourished female in no acute distress.  HENT:  Normocephalic, atraumatic, External right and left ear normal.  EYES: Conjunctivae and EOM are normal. Pupils are equal, round, and reactive to light. No scleral icterus.  NECK: Normal range of motion, supple, no masses.  Normal thyroid.  SKIN: Skin is warm and dry. No rash noted. Not diaphoretic. No erythema. No pallor. MUSCULOSKELETAL: Normal range of motion. No tenderness.  No cyanosis, clubbing, or edema. NEUROLOGIC: Alert and oriented to person, place, and time. Normal reflexes, muscle tone coordination.  PSYCHIATRIC: Normal mood and affect. Normal behavior. Normal judgment and thought content. CARDIOVASCULAR: Normal heart rate noted, regular  rhythm RESPIRATORY: Clear to auscultation bilaterally. Effort and breath sounds normal, no problems with respiration noted. BREASTS: Symmetric in size. No masses, tenderness, skin changes, nipple drainage, or lymphadenopathy bilaterally. Performed in the presence of a chaperone. ABDOMEN: Soft, no distention noted.  No tenderness, rebound or guarding.  PELVIC: Normal appearing external genitalia and urethral meatus; normal appearing vaginal mucosa and cervix.  No abnormal vaginal discharge noted.  Pap smear obtained.  Normal uterine size, no other palpable masses, no uterine or adnexal tenderness.  Performed in the  presence of a chaperone.   Assessment and Plan:  1. Well woman exam  - Cytology - PAP( [place])   Will follow up results of pap smear and manage accordingly. Mammogram scheduled Routine preventative health maintenance measures emphasized. Please refer to After Visit Summary for other counseling recommendations.    Hanny Elsberry, Artist Pais, [place] for [employer], [place]

## 2022-01-19 LAB — CYTOLOGY - PAP
Comment: NEGATIVE
Diagnosis: NEGATIVE
High risk HPV: NEGATIVE

## 2022-02-08 ENCOUNTER — Ambulatory Visit (INDEPENDENT_AMBULATORY_CARE_PROVIDER_SITE_OTHER): Payer: 59

## 2022-02-08 DIAGNOSIS — Z1231 Encounter for screening mammogram for malignant neoplasm of breast: Secondary | ICD-10-CM | POA: Diagnosis not present

## 2022-05-11 IMAGING — DX DG WRIST COMPLETE 3+V*R*
4 series · 4 of 4 positions shown · non-contrast
Comparison: None.

CLINICAL DATA: 59-year-old female with distal right radius pain for
4 weeks with no known injury.

EXAM:
RIGHT WRIST - COMPLETE 3+ VIEW

[wrist pa]
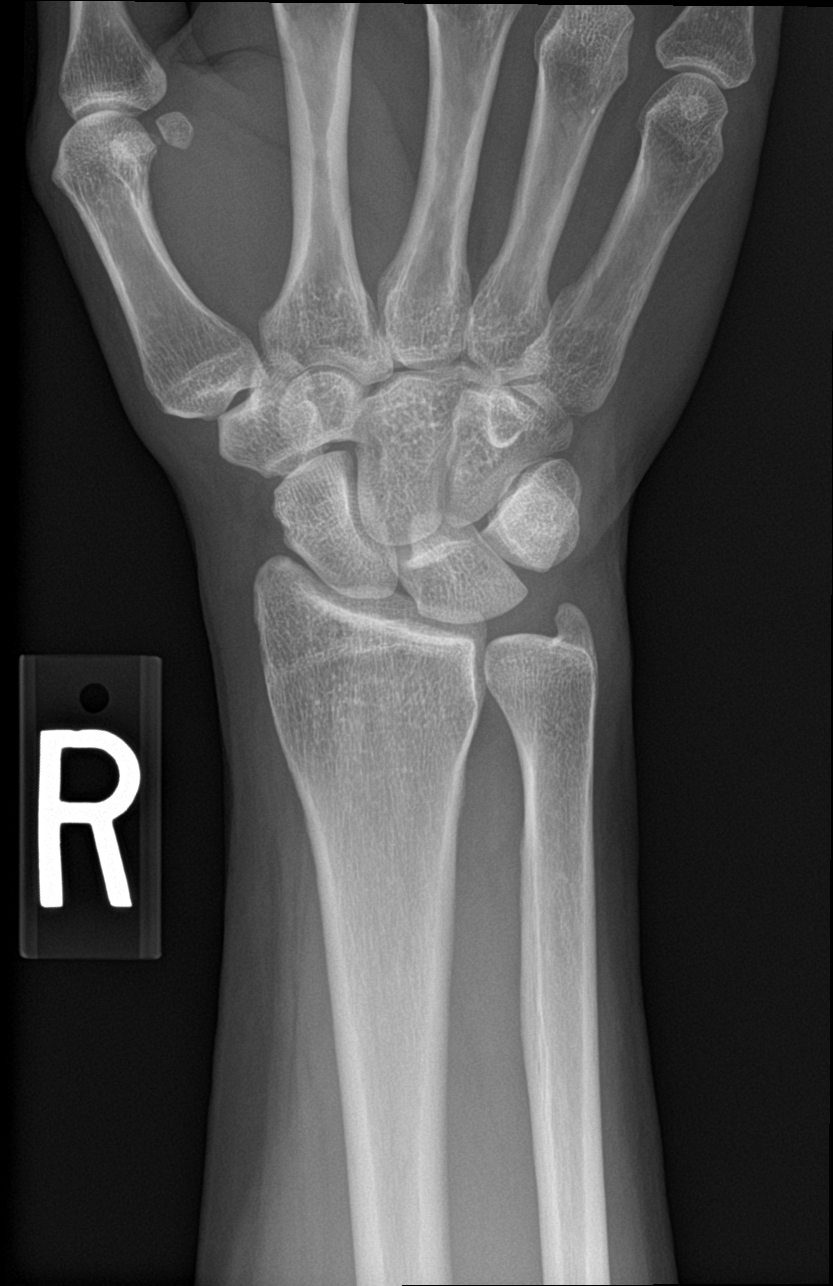

[wrist obl]
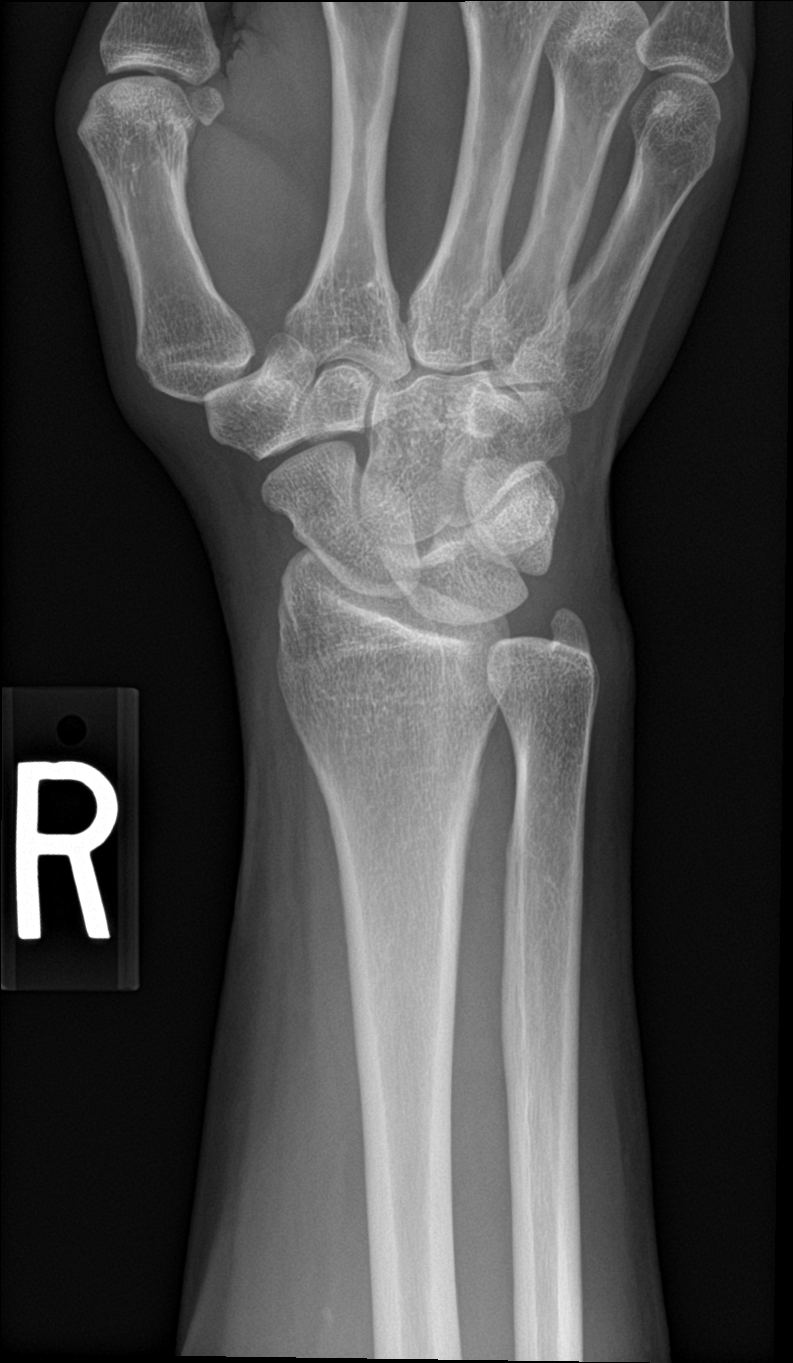

[wrist lat]
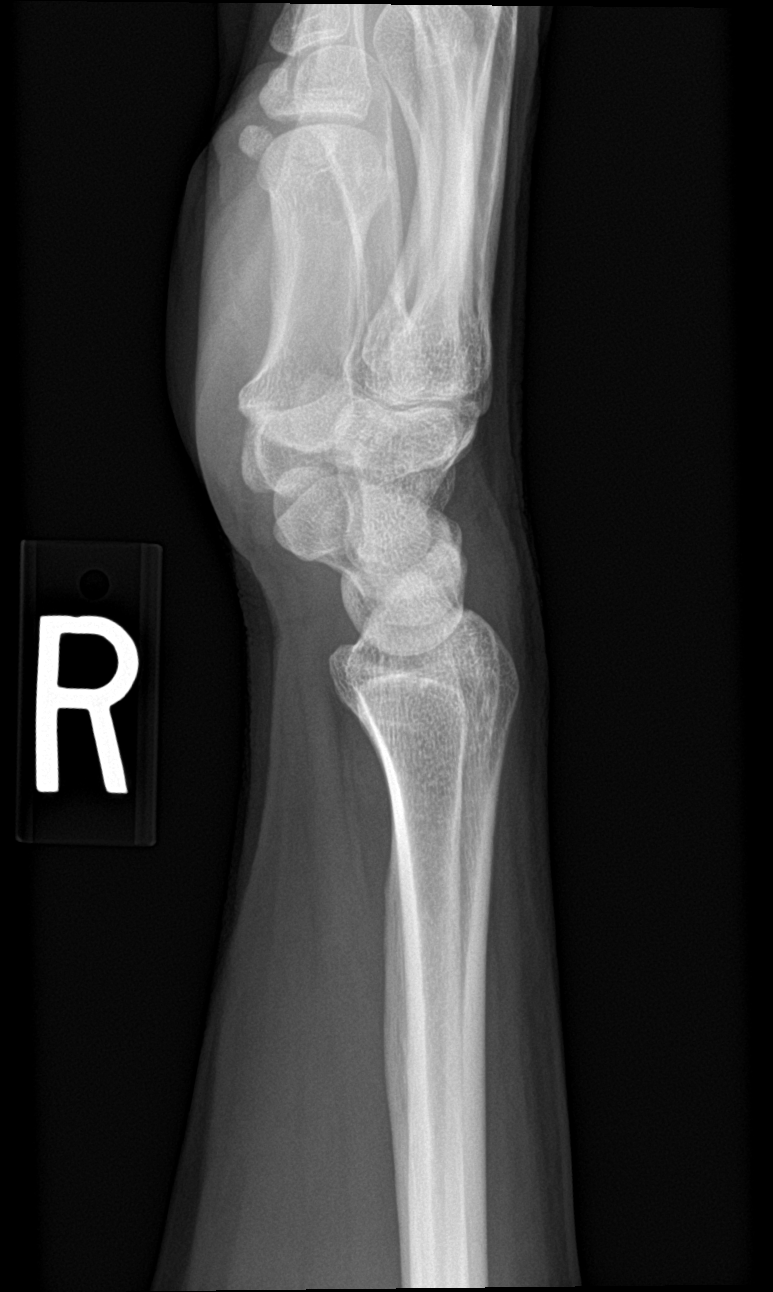

[wrist navicular]
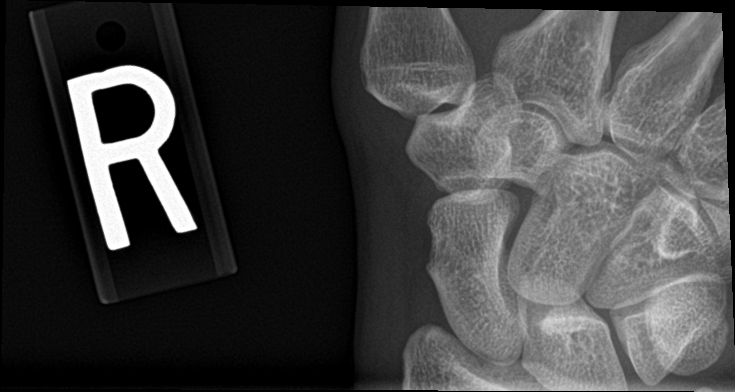

[4 of 4 positions shown; findings below may reference images not displayed]

FINDINGS: Bone mineralization is within normal limits for age. There is no
evidence of fracture or dislocation. There is no evidence of
arthropathy or other focal bone abnormality. Soft tissues are
unremarkable.
IMPRESSION: Negative.

## 2022-12-22 ENCOUNTER — Telehealth: Payer: Self-pay | Admitting: *Deleted

## 2022-12-22 NOTE — Telephone Encounter (Signed)
Left patient a message to call and schedule annual. 

## 2023-01-15 ENCOUNTER — Other Ambulatory Visit: Payer: Self-pay | Admitting: Obstetrics and Gynecology

## 2023-01-15 DIAGNOSIS — Z1231 Encounter for screening mammogram for malignant neoplasm of breast: Secondary | ICD-10-CM

## 2023-01-30 ENCOUNTER — Ambulatory Visit: Payer: 59 | Admitting: Obstetrics and Gynecology

## 2023-02-13 ENCOUNTER — Ambulatory Visit: Payer: 59 | Admitting: Obstetrics and Gynecology

## 2023-02-13 ENCOUNTER — Encounter: Payer: Self-pay | Admitting: Obstetrics and Gynecology

## 2023-02-13 VITALS — BP 119/76 | HR 61 | Ht 67.0 in | Wt 157.0 lb

## 2023-02-13 DIAGNOSIS — Z01419 Encounter for gynecological examination (general) (routine) without abnormal findings: Secondary | ICD-10-CM

## 2023-02-13 NOTE — Progress Notes (Signed)
GYNECOLOGY ANNUAL PREVENTATIVE CARE ENCOUNTER NOTE  History:     Correna Hoefling is a 63 y.o. G65P2002 female here for a routine annual gynecologic exam.  Current complaints: none.   Denies abnormal vaginal bleeding, discharge, pelvic pain, problems with intercourse or other gynecologic concerns.    Gynecologic History No LMP recorded. Patient is postmenopausal. Contraception: post menopausal status Last Pap: 01/2022. Result was normal with negative HPV Last Mammogram: 02/2022. Appt scheduled for March.  Result was normal Last Colonoscopy: never. Did test similar to Cologuard 2-3 years ago.  Result was normal  Obstetric History OB History  Gravida Para Term Preterm AB Living  2 2 2   2   SAB IAB Ectopic Multiple Live Births          # Outcome Date GA Lbr Len/2nd Weight Sex Type Anes PTL Lv  2 Term           1 Term             Past Medical History:  Diagnosis Date   Abnormal Pap smear of cervix    Angina pectoris (HCC) 03/06/2019   Chest pain 03/06/2019   De Quervain's tenosynovitis, right 08/21/2019   Dyslipidemia 03/24/2019   Dyspnea on exertion 03/24/2019   Episodic tension-type headache, not intractable 03/07/2019   Hot flashes 11/08/2017   HPV in female    Insomnia 03/07/2019   Menopause 11/08/2017   Pain in anterior left upper extremity 03/06/2019   Preventative health care 03/06/2019    Past Surgical History:  Procedure Laterality Date   HERNIA REPAIR  1963   LASER ABLATION CONDYLOMA CERVICAL / VULVAR      Current Outpatient Medications on File Prior to Visit  Medication Sig Dispense Refill   aspirin EC 81 MG tablet Take 1 tablet (81 mg total) by mouth daily. (Patient not taking: Reported on 02/13/2023) 90 tablet 3   No current facility-administered medications on file prior to visit.    Allergies  Allergen Reactions   Sulfa Antibiotics Hives and Rash    Social History:  reports that she has never smoked. She has never used smokeless tobacco. She reports current alcohol  use. She reports that she does not use drugs.  Family History  Problem Relation Age of Onset   Breast cancer Maternal Grandmother    Lymphoma Mother    Kidney cancer Brother    Breast cancer Maternal Aunt    Lymphoma Maternal Aunt     The following portions of the patient's history were reviewed and updated as appropriate: allergies, current medications, past family history, past medical history, past social history, past surgical history and problem list.  Review of Systems Pertinent items noted in HPI and remainder of comprehensive ROS otherwise negative.  Physical Exam:  BP 119/76   Pulse 61   Ht 5\' 7"  (1.702 m)   Wt 157 lb (71.2 kg)   BMI 24.59 kg/m  CONSTITUTIONAL: Well-developed, well-nourished female in no acute distress.  HENT:  Normocephalic, atraumatic, External right and left ear normal.  EYES: Conjunctivae and EOM are normal. Pupils are equal, round, and reactive to light. No scleral icterus.  NECK: Normal range of motion, supple, no masses.  Normal thyroid.  SKIN: Skin is warm and dry. No rash noted. Not diaphoretic. No erythema. No pallor. MUSCULOSKELETAL: Normal range of motion. No tenderness.  No cyanosis, clubbing, or edema. NEUROLOGIC: Alert and oriented to person, place, and time. Normal reflexes, muscle tone coordination.  PSYCHIATRIC: Normal mood and affect. Normal  behavior. Normal judgment and thought content. CARDIOVASCULAR: Normal heart rate noted, regular rhythm RESPIRATORY: Clear to auscultation bilaterally. Effort and breath sounds normal, no problems with respiration noted. BREASTS: Symmetric in size. No masses, tenderness, skin changes, nipple drainage, or lymphadenopathy bilaterally. Performed in the presence of a chaperone. ABDOMEN: Soft, no distention noted.  No tenderness, rebound or guarding.  PELVIC: not due Assessment and Plan:    1. Well woman exam (Primary) -continue care with PCP -discussed postmenopausal vaginal dryness. Currently uses  lubrication during intercourse, which works well- advised to reach out if additional support needed.  Normal breast examination today, she was advised to perform periodic self breast examinations.  Mammogram scheduled for breast cancer screening. Colon cancer screening is up to date. Discussed repeating Cologuard in 2-3 years if she prefers over colonoscopy. Emphasized that positive Cologuard tests will need to be follow up with diagnostic colonoscopy. Patient will decide and let us know her decision. Routine preventative health maintenance measures emphasized. Please refer to After Visit Summary for other counseling recommendations.      Marylynn Pearson, [facility] 02/13/23

## 2023-02-13 NOTE — Progress Notes (Deleted)
GYNECOLOGY ANNUAL PREVENTATIVE CARE ENCOUNTER NOTE  History:     Connie Massey is a 63 y.o. G52P2002 female here for a routine annual gynecologic exam.  Current complaints: ***.   Denies abnormal vaginal bleeding, discharge, pelvic pain, problems with intercourse or other gynecologic concerns.    Gynecologic History No LMP recorded. Patient is postmenopausal. Contraception: {method:5051} Last Pap: ***. Result was {norm/abn:16337} with negative HPV Last Mammogram: ***.  Result was {norm/abn:16337} Last Colonoscopy: ***.  Result was {norm/abn:16337}  Obstetric History OB History  Gravida Para Term Preterm AB Living  2 2 2   2   SAB IAB Ectopic Multiple Live Births          # Outcome Date GA Lbr Len/2nd Weight Sex Type Anes PTL Lv  2 Term           1 Term             Past Medical History:  Diagnosis Date   Abnormal Pap smear of cervix    Angina pectoris (HCC) 03/06/2019   Chest pain 03/06/2019   De Quervain's tenosynovitis, right 08/21/2019   Dyslipidemia 03/24/2019   Dyspnea on exertion 03/24/2019   Episodic tension-type headache, not intractable 03/07/2019   Hot flashes 11/08/2017   HPV in female    Insomnia 03/07/2019   Menopause 11/08/2017   Pain in anterior left upper extremity 03/06/2019   Preventative health care 03/06/2019    Past Surgical History:  Procedure Laterality Date   HERNIA REPAIR  1963   LASER ABLATION CONDYLOMA CERVICAL / VULVAR      Current Outpatient Medications on File Prior to Visit  Medication Sig Dispense Refill   aspirin EC 81 MG tablet Take 1 tablet (81 mg total) by mouth daily. (Patient not taking: Reported on 02/13/2023) 90 tablet 3   No current facility-administered medications on file prior to visit.    Allergies  Allergen Reactions   Sulfa Antibiotics Hives and Rash    Social History:  reports that she has never smoked. She has never used smokeless tobacco. She reports current alcohol use. She reports that she does not use drugs.  Family  History  Problem Relation Age of Onset   Breast cancer Maternal Grandmother    Lymphoma Mother    Kidney cancer Brother    Breast cancer Maternal Aunt    Lymphoma Maternal Aunt     The following portions of the patient's history were reviewed and updated as appropriate: allergies, current medications, past family history, past medical history, past social history, past surgical history and problem list.  Review of Systems Pertinent items noted in HPI and remainder of comprehensive ROS otherwise negative.  Physical Exam:  BP 119/76   Pulse 61   Ht 5\' 7"  (1.702 m)   Wt 157 lb (71.2 kg)   BMI 24.59 kg/m  CONSTITUTIONAL: Well-developed, well-nourished female in no acute distress.  HENT:  Normocephalic, atraumatic, External right and left ear normal.  EYES: Conjunctivae and EOM are normal. Pupils are equal, round, and reactive to light. No scleral icterus.  NECK: Normal range of motion, supple, no masses.  Normal thyroid.  SKIN: Skin is warm and dry. No rash noted. Not diaphoretic. No erythema. No pallor. MUSCULOSKELETAL: Normal range of motion. No tenderness.  No cyanosis, clubbing, or edema. NEUROLOGIC: Alert and oriented to person, place, and time. Normal reflexes, muscle tone coordination.  PSYCHIATRIC: Normal mood and affect. Normal behavior. Normal judgment and thought content. CARDIOVASCULAR: Normal heart rate noted, regular rhythm RESPIRATORY:  Clear to auscultation bilaterally. Effort and breath sounds normal, no problems with respiration noted. BREASTS: Symmetric in size. No masses, tenderness, skin changes, nipple drainage, or lymphadenopathy bilaterally. Performed in the presence of a chaperone. ABDOMEN: Soft, no distention noted.  No tenderness, rebound or guarding.  PELVIC: Normal appearing external genitalia and urethral meatus; normal appearing vaginal mucosa and cervix.  No abnormal vaginal discharge noted.  Pap smear obtained.  Normal uterine size, no other palpable  masses, no uterine or adnexal tenderness.  Performed in the presence of a chaperone.   Assessment and Plan:    There are no diagnoses linked to this encounter. Will follow up results of pap smear and manage accordingly. Normal breast examination today, she was advised to perform periodic self breast examinations.  Mammogram scheduled for breast cancer screening. Colon cancer screening is up to date***Discussed need for colon cancer screening over the age 80. Colonoscopy recommended as this is the gold standard. Referral made to Gastroenterology for colonoscopy*** Patient declined colonoscopy, so discussed Cologuard.   Emphasized that positive Cologuard tests will need to be follow up with diagnostic colonoscopy. Cologuard ordered.***Patient will decide and let us know her decision. Routine preventative health maintenance measures emphasized. Please refer to After Visit Summary for other counseling recommendations.      Jaynie Collins, MD, FACOG Obstetrician & Gynecologist, [facility] for [employer], [facility]

## 2023-03-14 ENCOUNTER — Ambulatory Visit: Payer: 59

## 2023-03-14 DIAGNOSIS — Z1231 Encounter for screening mammogram for malignant neoplasm of breast: Secondary | ICD-10-CM | POA: Diagnosis not present

## 2024-02-18 ENCOUNTER — Ambulatory Visit: Admitting: Obstetrics and Gynecology
